# Patient Record
Sex: Male | Born: 1980
Health system: Southern US, Community
[De-identification: ages and names within clinical notes are randomized; demographics above are authoritative.]

## PROBLEM LIST (undated history)

## (undated) DIAGNOSIS — K529 Noninfective gastroenteritis and colitis, unspecified: Secondary | ICD-10-CM

## (undated) DIAGNOSIS — G589 Mononeuropathy, unspecified: Secondary | ICD-10-CM

## (undated) HISTORY — DX: Noninfective gastroenteritis and colitis, unspecified: K52.9

## (undated) HISTORY — DX: Mononeuropathy, unspecified: G58.9

## (undated) HISTORY — PX: COLONOSCOPY: SHX174

---
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
## 2012-03-04 ENCOUNTER — Ambulatory Visit: Payer: Managed Care, Other (non HMO)

## 2012-03-04 ENCOUNTER — Ambulatory Visit (INDEPENDENT_AMBULATORY_CARE_PROVIDER_SITE_OTHER): Payer: Managed Care, Other (non HMO) | Admitting: Family Medicine

## 2012-03-04 VITALS — BP 136/80 | HR 54 | Temp 97.9°F | Resp 16 | Ht 69.0 in | Wt 174.0 lb

## 2012-03-04 DIAGNOSIS — R0789 Other chest pain: Secondary | ICD-10-CM

## 2012-03-04 DIAGNOSIS — K51911 Ulcerative colitis, unspecified with rectal bleeding: Secondary | ICD-10-CM | POA: Insufficient documentation

## 2012-03-04 DIAGNOSIS — K5190 Ulcerative colitis, unspecified, without complications: Secondary | ICD-10-CM | POA: Insufficient documentation

## 2012-03-04 DIAGNOSIS — R071 Chest pain on breathing: Secondary | ICD-10-CM

## 2012-03-04 MED ORDER — MELOXICAM 15 MG PO TABS: 15.0000 mg | ORAL_TABLET | Freq: Every day | ORAL | Status: DC

## 2012-03-04 NOTE — Progress Notes (Signed)
Subjective:    Patient ID: Spencer Mccarthy, male    DOB: January 14, 1981, 31 y.o.   MRN: [id number]  HPI This 31 y.o. male presents for evaluation of rib pain x 2.5 weeks.  Describes weakness in the muscles along his spine, and anterior rib pain, with intermittent cramping and the sensation that his air is getting pushed out of his lungs when he's rotating.  The symptoms resolve with activity, and can be "excruciating" when he's at rest.  Wakes in the morning with pain, but the pain does not wake him from sleep.  No shortness of breath.  No cough.  He's able to jog several miles, as usual, without pain or discomfort.  He's under lots of stress, at work, school, moving, etc.  Review of Systems As above.  Past Medical History  Diagnosis Date  . Pinched nerve     low back  . Colitis     Past Surgical History  Procedure Date  . Colonoscopy     Prior to Admission medications   Medication Sig Start Date End Date Taking? Authorizing Provider  mesalamine (LIALDA) 1.2 G EC tablet Take 1,200 mg by mouth daily with breakfast.   Yes Historical Provider, MD    No Known Allergies  History   Social History  . Marital Status: Single    Spouse Name: n/a    Number of Children: 0  . Years of Education: 15   Occupational History  . Painter-Automotive refinishing   . Student     Business-[employer]   Social History Main Topics  . Smoking status: Current Every Day Smoker -- 0.5 packs/day    Types: Cigarettes  . Smokeless tobacco: Never Used   Comment: has quit x 8 weeks previously, will quit by 05/2012 for insurance costs.  . Alcohol Use: 0.0 - 1.0 oz/week    0-2 drink(s) per week  . Drug Use: No  . Sexually Active: Yes -- Male partner(s)    Birth Control/ Protection: Condom   Other Topics Concern  . Not on file   Social History Narrative   Lives with 2 roommates, will live alone beginning 03/2012.    Family History  Problem Relation Age of Onset  . Colitis Mother          Objective:   Physical Exam Blood pressure 136/80, pulse 54, temperature 97.9 F (36.6 C), temperature source Oral, resp. rate 16, height 5' 9"  (1.753 m), weight 174 lb (78.926 kg), SpO2 98.00%. Body mass index is 25.70 kg/(m^2). Well-developed, well nourished WM who is awake, alert and oriented, in NAD. HEENT: [place]/AT, PERRL, EOMI.  Sclera and conjunctiva are clear.  EAC are patent, TMs are normal in appearance. Nasal mucosa is pink and moist. OP is clear. Neck: supple, non-tender, no lymphadenopathy, thyromegaly. Heart: RRR, no murmur Lungs: normal effort, CTA Chest Wall: non-tender with palpation of the ribs. Abdomen: normo-active bowel sounds, supple, non-tender, no mass or organomegaly. Extremities: no cyanosis, clubbing or edema. Back: No tenderness,  FROM without pain.   Skin: warm and dry without rash. Psychologic: good mood and appropriate affect, normal speech and behavior.  CXR: [facility] reading (PRIMARY) by  Dr. Lorelei Pont.  No acute findings.  Normal chest.      Assessment & Plan:   1. Chest wall pain  DG Chest 2 View, meloxicam (MOBIC) 15 MG tablet   Undetermined etiology.  Trial of meloxicam daily, and leftover flexeril at HS.  RTC if symptoms persist. OOW x several days; consider stress as cause  for these symptoms.

## 2012-10-08 ENCOUNTER — Ambulatory Visit: Payer: Managed Care, Other (non HMO)

## 2012-11-03 ENCOUNTER — Ambulatory Visit (INDEPENDENT_AMBULATORY_CARE_PROVIDER_SITE_OTHER): Payer: Managed Care, Other (non HMO) | Admitting: Family Medicine

## 2012-11-03 VITALS — BP 110/68 | HR 56 | Temp 98.0°F | Resp 18 | Ht 69.0 in | Wt 176.0 lb

## 2012-11-03 DIAGNOSIS — M545 Low back pain, unspecified: Secondary | ICD-10-CM

## 2012-11-03 MED ORDER — PREDNISONE 20 MG PO TABS: ORAL_TABLET | ORAL | Status: DC

## 2012-11-03 NOTE — Patient Instructions (Signed)
Yoga, swimming will help protect the back

## 2012-11-03 NOTE — Progress Notes (Signed)
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  He has had intermittent low back pain over several years, and the pain has been returning.  He could not work because of the pain yesterday and today.  The pain is more left sided along the belt line and radiates into the gluteal area.  No Numbness, but the leg feels a little weak when he first starts moving in the morning.  The pain is worse after lying still for awhile  No trouble with bladder.  He has a history of colitis which used to be treated with Lialda.  He no longer has colitis nor takes medicine for this.  Exercise:  Trail biking, jogging  No fever.  Objective:  NAD Normal abdominal exam Normal back inspection, palpation SLR weakly  Positive at 60 degrees on left, neg on right Normal KJ and AJ Good back ROM except painful forward flexion Negative knee crossover test Normal hip ROM  Assessment:  Recurrent back strain with mild sciatica  Plan:   Resume yoga, knee stretches.

## 2014-12-08 ENCOUNTER — Ambulatory Visit (INDEPENDENT_AMBULATORY_CARE_PROVIDER_SITE_OTHER): Payer: 59 | Admitting: Physician Assistant

## 2014-12-08 VITALS — BP 100/66 | HR 54 | Temp 98.3°F | Resp 16 | Ht 68.0 in | Wt 178.0 lb

## 2014-12-08 DIAGNOSIS — Z8719 Personal history of other diseases of the digestive system: Secondary | ICD-10-CM

## 2014-12-08 DIAGNOSIS — M545 Low back pain, unspecified: Secondary | ICD-10-CM

## 2014-12-08 MED ORDER — PREDNISONE 20 MG PO TABS: ORAL_TABLET | ORAL | Status: DC

## 2014-12-08 MED ORDER — CYCLOBENZAPRINE HCL 10 MG PO TABS: 10.0000 mg | ORAL_TABLET | Freq: Every day | ORAL | Status: DC

## 2014-12-08 MED ORDER — MELOXICAM 15 MG PO TABS: 15.0000 mg | ORAL_TABLET | Freq: Every day | ORAL | Status: DC

## 2014-12-08 NOTE — Progress Notes (Signed)
12/09/2014 at 6:37 PM  Spencer Mccarthy / DOB: 10-Jul-1980 / MRN: [id number]  The patient has Ulcerative colitis on his problem list.  SUBJECTIVE  Chief complaint: Back Pain   [name] is a 34 y.o. male with a history of sporadic low back pain complaining of stable "dull" left sided low back pain that started 4 days ago. Associated symptoms include no other symptoms, and he denies weakness, numbness, tingling, dysuria, leg pain, perianal numbness, and bowel and bladder incontinence.Treatments tried thus far include prescription NSAIDS with poor relief. With previous episodes of pain he has had good success with steroid therapy.  Patient was diagnosed with Ulcerative Colitis two years previous and has been off of his medications since that time.  He would like a referral to GI to reestablish care.    He  has a past medical history of Pinched nerve; Colitis; and Colitis.    Medications reviewed and updated by myself where necessary, and exist elsewhere in the encounter.   [name] has No Known Allergies. He  reports that he has been smoking Cigarettes.  He has been smoking about 0.50 packs per day. He has never used smokeless tobacco. He reports that he drinks alcohol. He reports that he does not use illicit drugs. He  reports that he currently engages in sexual activity and has had male partners. He reports using the following method of birth control/protection: Condom. The patient  has past surgical history that includes Colonoscopy.  His family history includes Colitis in his mother.  Review of Systems  Constitutional: Negative for fever.  Eyes: Negative.   Respiratory: Negative.   Cardiovascular: Negative.   Genitourinary: Negative for dysuria, urgency and frequency.  Musculoskeletal: Positive for myalgias and back pain. Negative for falls.  Skin: Negative for itching and rash.  Neurological: Negative for dizziness and headaches.    OBJECTIVE  His  height is 5'  8" (1.727 m) and weight is 178 lb (80.74 kg). His oral temperature is 98.3 F (36.8 C). His blood pressure is 100/66 and his pulse is 54. His respiration is 16 and oxygen saturation is 98%.  The patient's body mass index is 27.07 kg/(m^2).  Physical Exam  Constitutional: He is oriented to person, place, and time. He appears well-developed.  HENT:  Head: Normocephalic.  Eyes: Pupils are equal, round, and reactive to light.  Neck: Normal range of motion.  Cardiovascular: Normal rate.   Respiratory: Effort normal.  GI: Soft.  Musculoskeletal: Normal range of motion.  Neurological: He is alert and oriented to person, place, and time. He has normal strength. He displays normal reflexes. No cranial nerve deficit or sensory deficit. Coordination and gait normal. He displays no Babinski's sign on the right side. He displays no Babinski's sign on the left side.  Reflex Scores:      Patellar reflexes are 2+ on the right side and 2+ on the left side.      Achilles reflexes are 2+ on the right side and 2+ on the left side. Negative SLR   Skin: Skin is warm and dry.    No results found for this or any previous visit (from the past 24 hour(s)).  ASSESSMENT & PLAN  Spencer Mccarthy was seen today for back pain.  Diagnoses and all orders for this visit:  History of ulcerative colitis Orders: -     Ambulatory referral to Gastroenterology  Left-sided low back pain without sciatica Orders: -     predniSONE (DELTASONE) 20 MG tablet;  Take 3 PO QAM x3days, 2 PO QAM x3days, 1 PO QAM x3days -     meloxicam (MOBIC) 15 MG tablet; Take 1 tablet (15 mg total) by mouth daily. -     cyclobenzaprine (FLEXERIL) 10 MG tablet; Take 1 tablet (10 mg total) by mouth at bedtime.    The patient was advised to call or come back to clinic if he does not see an improvement in symptoms, or worsens with the above plan.   Philis Fendt, MHS, PA-C [facility] 12/09/2014 6:37  PM

## 2014-12-16 NOTE — Progress Notes (Signed)
  Medical screening examination/treatment/procedure(s) were performed by non-physician practitioner and as supervising physician I was immediately available for consultation/collaboration.     

## 2015-04-20 ENCOUNTER — Ambulatory Visit (INDEPENDENT_AMBULATORY_CARE_PROVIDER_SITE_OTHER): Payer: BLUE CROSS/BLUE SHIELD | Admitting: Physician Assistant

## 2015-04-20 VITALS — BP 122/68 | HR 55 | Temp 97.8°F | Resp 18 | Ht 69.5 in | Wt 182.0 lb

## 2015-04-20 DIAGNOSIS — J069 Acute upper respiratory infection, unspecified: Secondary | ICD-10-CM

## 2015-04-20 DIAGNOSIS — Z8719 Personal history of other diseases of the digestive system: Secondary | ICD-10-CM

## 2015-04-20 MED ORDER — MESALAMINE 1.2 G PO TBEC: 1.2000 g | DELAYED_RELEASE_TABLET | Freq: Every day | ORAL | Status: DC

## 2015-04-20 MED ORDER — AZELASTINE HCL 0.1 % NA SOLN: 2.0000 | Freq: Two times a day (BID) | NASAL | Status: DC

## 2015-04-20 MED ORDER — CEFDINIR 300 MG PO CAPS: 600.0000 mg | ORAL_CAPSULE | Freq: Every day | ORAL | Status: AC

## 2015-04-20 NOTE — Progress Notes (Signed)
Patient ID: Spencer Mccarthy, male    DOB: 1981/02/05, 34 y.o.   MRN: [id number]  PCP: No primary care provider on file.  Subjective:   Chief Complaint  Patient presents with  . Nasal Congestion    head congestion since saturday     HPI Presents for evaluation of congestion.  Doesn't feel weak, but progressively "sludging up." Alka seltzer, nasal spray (saline), Vitamin C, whiskey. Without benefit. Recently quit smoking using the patch. Notes he has really vivid dreams. He has to remove the patch about 3 hours before bed.   No flu vaccine yet this season.     Review of Systems  Constitutional: Positive for fatigue. Negative for fever, chills, diaphoresis, activity change and appetite change.  HENT: Positive for congestion, postnasal drip and rhinorrhea. Negative for dental problem, ear pain, facial swelling, sneezing, sore throat and trouble swallowing.   Eyes: Negative for pain.  Respiratory: Negative for cough and shortness of breath.   Cardiovascular: Negative for chest pain and palpitations.  Gastrointestinal: Negative for nausea and diarrhea.  Musculoskeletal: Negative for myalgias and arthralgias.  Neurological: Negative for dizziness and headaches.  Hematological: Negative for adenopathy.       Patient Active Problem List   Diagnosis Date Noted  . Ulcerative colitis ([place]) 03/04/2012     Prior to Admission medications   Medication Sig Start Date End Date Taking? Authorizing Provider  cyclobenzaprine (FLEXERIL) 10 MG tablet Take 1 tablet (10 mg total) by mouth at bedtime. 12/08/14  Yes Tereasa Coop, PA-C  meloxicam (MOBIC) 15 MG tablet Take 1 tablet (15 mg total) by mouth daily. 12/08/14  Yes Tereasa Coop, PA-C  mesalamine (LIALDA) 1.2 G EC tablet Take 1,200 mg by mouth daily with breakfast.    Historical Provider, MD     No Known Allergies     Objective:  Physical Exam  Constitutional: He is oriented to person, place, and time. Vital signs are  normal. He appears well-developed and well-nourished. He is active and cooperative. No distress.  BP 122/68 mmHg  Pulse 55  Temp(Src) 97.8 F (36.6 C) (Oral)  Resp 18  Ht 5' 9.5" (1.765 m)  Wt 182 lb (82.555 kg)  BMI 26.50 kg/m2  SpO2 98%   HENT:  Head: Normocephalic and atraumatic.  Right Ear: Hearing, tympanic membrane, external ear and ear canal normal.  Left Ear: Hearing, tympanic membrane, external ear and ear canal normal.  Nose: Mucosal edema and rhinorrhea present.  No foreign bodies. Right sinus exhibits no maxillary sinus tenderness and no frontal sinus tenderness. Left sinus exhibits no maxillary sinus tenderness and no frontal sinus tenderness.  Mouth/Throat: Uvula is midline, oropharynx is clear and moist and mucous membranes are normal. No uvula swelling. No oropharyngeal exudate.  Eyes: Conjunctivae and EOM are normal. Pupils are equal, round, and reactive to light. Right eye exhibits no discharge. Left eye exhibits no discharge. No scleral icterus.  Neck: Trachea normal, normal range of motion and full passive range of motion without pain. Neck supple. No thyroid mass and no thyromegaly present.  Cardiovascular: Normal rate, regular rhythm and normal heart sounds.   Pulmonary/Chest: Effort normal and breath sounds normal.  Lymphadenopathy:       Head (right side): No submandibular, no tonsillar, no preauricular, no posterior auricular and no occipital adenopathy present.       Head (left side): No submandibular, no tonsillar, no preauricular and no occipital adenopathy present.    He has no cervical adenopathy.  Right: No supraclavicular adenopathy present.       Left: No supraclavicular adenopathy present.  Neurological: He is alert and oriented to person, place, and time. He has normal strength. No cranial nerve deficit or sensory deficit.  Skin: Skin is warm, dry and intact. No rash noted.  Psychiatric: He has a normal mood and affect. His speech is normal and  behavior is normal.           Assessment & Plan:   1. Acute upper respiratory infection Supportive care. ANticipatory guidance. Anticipate improvement in 48 hours and resolution by day 8. If he does not begin to improve on Saturday 12/10, he will go ahead and start the antibiotic. - azelastine (ASTELIN) 0.1 % nasal spray; Place 2 sprays into both nostrils 2 (two) times daily. Use in each nostril as directed  Dispense: 30 mL; Refill: 0 - cefdinir (OMNICEF) 300 MG capsule; Take 2 capsules (600 mg total) by mouth daily.  Dispense: 20 capsule; Refill: 0  2. History of ulcerative colitis He is out of Lialda and is about to move to [place]. His insurance will change next week and then he will need to establish with a new PCP in [place] in order to establish with anew GI specialist. I refilled the Lialda for 30 days to help him transition. - mesalamine (LIALDA) 1.2 G EC tablet; Take 1 tablet (1.2 g total) by mouth daily with breakfast.  Dispense: 30 tablet; Refill: 0   Fara Chute, PA-C Physician Assistant-Certified [facility]

## 2015-04-20 NOTE — Patient Instructions (Signed)
Get plenty of rest and drink at least 64 ounces of water daily.  Use the Mucinex and an OTC oral antihistamine (Allegra, Claritin or Zyrtec). If the symptoms have not begun to resolve by Saturday, fill the prescription for Cefdinir.

## 2015-10-31 ENCOUNTER — Other Ambulatory Visit: Payer: Self-pay | Admitting: Physician Assistant

## 2015-10-31 ENCOUNTER — Telehealth: Payer: Self-pay

## 2015-10-31 NOTE — Telephone Encounter (Signed)
I responded to a RF request from a local pharm that was sent this afternoon. He can have it transferred. Chelle instr'd him to est care w/new PCP in [place] since he was moving and only gave him 1 RF in Dec. Has pt moved? The pharm number pt left appears to be from [place]. Pt must get further refills from new PCP, or RTC here for re-check. We will need to try to call pt back when phones are working.

## 2015-10-31 NOTE — Telephone Encounter (Signed)
Pt is checking on a status of a refill request for lialida The pharnacy number is [id number]

## 2015-11-01 NOTE — Telephone Encounter (Signed)
LM on Vm to RTC.

## 2015-12-18 ENCOUNTER — Other Ambulatory Visit: Payer: Self-pay | Admitting: Physician Assistant

## 2015-12-22 ENCOUNTER — Encounter: Payer: Self-pay | Admitting: Physician Assistant

## 2015-12-22 ENCOUNTER — Ambulatory Visit (INDEPENDENT_AMBULATORY_CARE_PROVIDER_SITE_OTHER): Payer: 59 | Admitting: Physician Assistant

## 2015-12-22 VITALS — BP 124/66 | HR 55 | Temp 98.5°F | Resp 16 | Ht 68.75 in | Wt 192.4 lb

## 2015-12-22 DIAGNOSIS — Z8719 Personal history of other diseases of the digestive system: Secondary | ICD-10-CM | POA: Diagnosis not present

## 2015-12-22 MED ORDER — MESALAMINE 1.2 G PO TBEC: DELAYED_RELEASE_TABLET | ORAL | 3 refills | Status: AC

## 2015-12-22 NOTE — Patient Instructions (Addendum)
Make appt with your GI doctor for medication management   Thank you for coming in today. I hope you feel we met your needs.  Feel free to call [facility] if you have any questions or further requests.  Please consider signing up for MyChart if you do not already have it, as this is a great way to communicate with me.  Best,  Whitney McVey, PA-C    IF you received an x-ray today, you will receive an invoice from [facility]. Please contact [facility] at [phone] with questions or concerns regarding your invoice.   IF you received labwork today, you will receive an invoice from [facility] [employer]. Please contact [facility] at [phone] with questions or concerns regarding your invoice.   Our billing staff will not be able to assist you with questions regarding bills from these companies.  You will be contacted with the lab results as soon as they are available. The fastest way to get your results is to activate your My Chart account. Instructions are located on the last page of this paperwork. If you have not heard from [place] regarding the results in 2 weeks, please contact this office.

## 2015-12-22 NOTE — Progress Notes (Signed)
   Spencer Mccarthy  MRN: [id number] DOB: 09/12/1980  PCP: No primary care provider on file.  Subjective:  Pt presents to clinic for refill of Lialda.  He recently moved to [place], but is moving back soon. Did not establish care there as previously discussed at his last appointment and would like to continue care at [facility].   Has not been adhearant. A few months ago he felt better so he stopped taking it. He then had a UC flare.  Recently quit smoking, says he has noticed an improvement in symptoms since then.   Sees GI once a year for UC check-up. [facility] - Felix Pacini, MD.   Review of Systems  Constitutional: Negative.   Cardiovascular: Negative for chest pain and palpitations.  Gastrointestinal: Positive for abdominal pain (intermittently ). Negative for abdominal distention, blood in stool, constipation, diarrhea and nausea.    Patient Active Problem List   Diagnosis Date Noted  . Ulcerative colitis ([place]) 03/04/2012    Current Outpatient Prescriptions on File Prior to Visit  Medication Sig Dispense Refill  . LIALDA 1.2 g EC tablet TAKE 1 TABLET (1.2 G TOTAL) BY MOUTH DAILY WITH BREAKFAST. 30 tablet 0   No current facility-administered medications on file prior to visit.     No Known Allergies  Objective:  BP 124/66 (BP Location: Right Arm, Patient Position: Sitting, Cuff Size: Normal)   Pulse (!) 55   Temp 98.5 F (36.9 C) (Oral)   Resp 16   Ht 5' 8.75" (1.746 m)   Wt 192 lb 6.4 oz (87.3 kg)   SpO2 98%   BMI 28.62 kg/m   Physical Exam  Constitutional: He is oriented to person, place, and time and well-developed, well-nourished, and in no distress. No distress.  HENT:  Head: Normocephalic and atraumatic.  Cardiovascular: Normal rate and regular rhythm.   Abdominal: Soft. Bowel sounds are normal. There is no tenderness. There is no guarding.  Neurological: He is alert and oriented to person, place, and time. Gait normal.  Skin: Skin is warm and dry. He  is not diaphoretic.  Psychiatric: Mood, memory, affect and judgment normal.  Vitals reviewed.   Assessment and Plan :  1. History of ulcerative colitis - mesalamine (LIALDA) 1.2 g EC tablet; TAKE 1 TABLET (1.2 G TOTAL) BY MOUTH DAILY WITH BREAKFAST.  Dispense: 30 tablet; Refill: 3  - Discussed with patient Lialda medication management through his GI specialist who diagnosed him with UC.  He originally filled Lialda with [facility] because of cost: with GI it was $500, [facility] was $30.  Recent insurance change, so he will investigate how much it will cost to get medication there.  He is going on his honeymoon in Sept and is moving back to [place] from [place]. Patient was given a 3 month supply to get him through to his GI appointment.    Mercer Pod, PA-C  [facility] 12/22/2015 2:09 PM

## 2016-09-30 DIAGNOSIS — K5150 Left sided colitis without complications: Secondary | ICD-10-CM | POA: Diagnosis not present

## 2016-11-25 DIAGNOSIS — K5150 Left sided colitis without complications: Secondary | ICD-10-CM | POA: Diagnosis not present

## 2017-04-19 DIAGNOSIS — Z23 Encounter for immunization: Secondary | ICD-10-CM | POA: Diagnosis not present

## 2017-06-05 DIAGNOSIS — K5150 Left sided colitis without complications: Secondary | ICD-10-CM | POA: Diagnosis not present

## 2017-09-03 DIAGNOSIS — J00 Acute nasopharyngitis [common cold]: Secondary | ICD-10-CM | POA: Diagnosis not present

## 2017-10-11 ENCOUNTER — Other Ambulatory Visit: Payer: Self-pay

## 2017-10-11 ENCOUNTER — Encounter: Payer: Self-pay | Admitting: Physician Assistant

## 2017-10-11 ENCOUNTER — Ambulatory Visit (INDEPENDENT_AMBULATORY_CARE_PROVIDER_SITE_OTHER): Payer: 59 | Admitting: Physician Assistant

## 2017-10-11 VITALS — BP 120/66 | HR 52 | Temp 98.1°F | Ht 69.0 in | Wt 190.6 lb

## 2017-10-11 DIAGNOSIS — R21 Rash and other nonspecific skin eruption: Secondary | ICD-10-CM

## 2017-10-11 DIAGNOSIS — W57XXXA Bitten or stung by nonvenomous insect and other nonvenomous arthropods, initial encounter: Secondary | ICD-10-CM | POA: Diagnosis not present

## 2017-10-11 DIAGNOSIS — L299 Pruritus, unspecified: Secondary | ICD-10-CM

## 2017-10-11 MED ORDER — PREDNISONE 20 MG PO TABS: ORAL_TABLET | ORAL | 0 refills | Status: AC

## 2017-10-11 MED ORDER — PERMETHRIN 5 % EX CREA: TOPICAL_CREAM | CUTANEOUS | 1 refills | Status: DC

## 2017-10-11 MED ORDER — DOXYCYCLINE HYCLATE 100 MG PO CAPS: 100.0000 mg | ORAL_CAPSULE | Freq: Two times a day (BID) | ORAL | 0 refills | Status: AC

## 2017-10-11 NOTE — Patient Instructions (Addendum)
Start the prednisone and doxy today.  I will go ahead and use the cream from the neck down and leave on for 12 hours as well.  I expect she will feel better in the next 2 to 3 days.  If any problems please come back and see me.    IF you received an x-ray today, you will receive an invoice from [facility]. Please contact [facility] at [phone] with questions or concerns regarding your invoice.   IF you received labwork today, you will receive an invoice from [facility]. Please contact [facility] at [phone] with questions or concerns regarding your invoice.   Our billing staff will not be able to assist you with questions regarding bills from these companies.  You will be contacted with the lab results as soon as they are available. The fastest way to get your results is to activate your My Chart account. Instructions are located on the last page of this paperwork. If you have not heard from [place] regarding the results in 2 weeks, please contact this office.

## 2017-10-11 NOTE — Progress Notes (Signed)
10/13/2017 1:45 PM   DOB: April 02, 1981 / MRN: [id number]  SUBJECTIVE:  Spencer Mccarthy is a 37 y.o. male presenting for rash and fatigue.  Tells me the rash started a few days ago and was preceded by a tick bite in which the tick was removed quickly.  Been doing lots of yard work lately.  Complains mostly of rash about the legs and arms.  Denies fever.  He has No Known Allergies.   He  has a past medical history of Colitis and Pinched nerve.    He  reports that he has quit smoking. His smoking use included cigarettes. He smoked 0.50 packs per day. He has never used smokeless tobacco. He reports that he drinks alcohol. He reports that he does not use drugs. He  reports that he currently engages in sexual activity and has had partners who are Male. He reports using the following method of birth control/protection: Condom. The patient  has a past surgical history that includes Colonoscopy.  His family history includes Colitis in his mother.  Review of Systems  Constitutional: Negative for chills, diaphoresis and fever.  Eyes: Negative.   Respiratory: Negative for cough, hemoptysis, sputum production, shortness of breath and wheezing.   Cardiovascular: Negative for chest pain, orthopnea and leg swelling.  Gastrointestinal: Negative for abdominal pain, blood in stool, constipation, diarrhea, heartburn, melena, nausea and vomiting.  Genitourinary: Negative for dysuria, flank pain, frequency, hematuria and urgency.  Skin: Positive for itching and rash.  Neurological: Negative for dizziness, sensory change, speech change, focal weakness and headaches.    The problem list and medications were reviewed and updated by myself where necessary and exist elsewhere in the encounter.   OBJECTIVE:  BP 120/66 (BP Location: Left Arm, Patient Position: Sitting, Cuff Size: Normal)   Pulse (!) 52   Temp 98.1 F (36.7 C) (Oral)   Ht 5' 9"  (1.753 m)   Wt 190 lb 9.6 oz (86.5 kg)   SpO2 97%   BMI 28.15  kg/m   Physical Exam  Constitutional: He is oriented to person, place, and time. He appears well-developed. He does not appear ill.  Eyes: Pupils are equal, round, and reactive to light. Conjunctivae and EOM are normal.  Cardiovascular: Normal rate, regular rhythm, S1 normal, S2 normal, normal heart sounds, intact distal pulses and normal pulses. Exam reveals no gallop and no friction rub.  No murmur heard. Pulmonary/Chest: Effort normal. No stridor. No respiratory distress. He has no wheezes. He has no rales.  Abdominal: He exhibits no distension.  Musculoskeletal: Normal range of motion. He exhibits no edema.  Neurological: He is alert and oriented to person, place, and time. No cranial nerve deficit. Coordination normal.  Skin: Skin is warm and dry. He is not diaphoretic.  Psychiatric: He has a normal mood and affect.  Nursing note and vitals reviewed.   No results found for this or any previous visit (from the past 72 hour(s)).  No results found.  ASSESSMENT AND PLAN:  Spencer Mccarthy was seen today for red spots all over body.  Diagnoses and all orders for this visit:  Papular rash -     predniSONE (DELTASONE) 20 MG tablet; Take 3 in the morning for 3 days, then 2 in the morning for 3 days, and then 1 in the morning for 3 days.  Tick bite, initial encounter -     doxycycline (VIBRAMYCIN) 100 MG capsule; Take 1 capsule (100 mg total) by mouth 2 (two) times daily for 10  days.  Pruritic condition -     predniSONE (DELTASONE) 20 MG tablet; Take 3 in the morning for 3 days, then 2 in the morning for 3 days, and then 1 in the morning for 3 days. -     permethrin (ELIMITE) 5 % cream; Apply the lotion from the neck down and leave on for 12 hours.  Repeat in 7 days.    The patient is advised to call or return to clinic if he does not see an improvement in symptoms, or to seek the care of the closest emergency department if he worsens with the above plan.   Philis Fendt, MHS, PA-C [facility] 10/13/2017 1:45 PM

## 2017-10-31 DIAGNOSIS — A0472 Enterocolitis due to Clostridium difficile, not specified as recurrent: Secondary | ICD-10-CM | POA: Diagnosis not present

## 2017-10-31 DIAGNOSIS — R05 Cough: Secondary | ICD-10-CM | POA: Diagnosis not present

## 2017-10-31 DIAGNOSIS — R030 Elevated blood-pressure reading, without diagnosis of hypertension: Secondary | ICD-10-CM | POA: Diagnosis not present

## 2018-02-05 ENCOUNTER — Other Ambulatory Visit: Payer: Self-pay

## 2018-02-05 ENCOUNTER — Ambulatory Visit (INDEPENDENT_AMBULATORY_CARE_PROVIDER_SITE_OTHER): Payer: 59

## 2018-02-05 ENCOUNTER — Ambulatory Visit (INDEPENDENT_AMBULATORY_CARE_PROVIDER_SITE_OTHER): Payer: 59 | Admitting: Urgent Care

## 2018-02-05 ENCOUNTER — Encounter: Payer: Self-pay | Admitting: Urgent Care

## 2018-02-05 VITALS — BP 151/79 | HR 67 | Temp 98.2°F | Resp 18 | Ht 69.0 in | Wt 192.6 lb

## 2018-02-05 DIAGNOSIS — J22 Unspecified acute lower respiratory infection: Secondary | ICD-10-CM

## 2018-02-05 DIAGNOSIS — R05 Cough: Secondary | ICD-10-CM

## 2018-02-05 DIAGNOSIS — R059 Cough, unspecified: Secondary | ICD-10-CM

## 2018-02-05 DIAGNOSIS — Z87891 Personal history of nicotine dependence: Secondary | ICD-10-CM

## 2018-02-05 DIAGNOSIS — R0989 Other specified symptoms and signs involving the circulatory and respiratory systems: Secondary | ICD-10-CM

## 2018-02-05 MED ORDER — AZITHROMYCIN 250 MG PO TABS: ORAL_TABLET | ORAL | 0 refills | Status: DC

## 2018-02-05 MED ORDER — HYDROCODONE-HOMATROPINE 5-1.5 MG/5ML PO SYRP: 5.0000 mL | ORAL_SOLUTION | Freq: Every evening | ORAL | 0 refills | Status: DC | PRN

## 2018-02-05 MED ORDER — PREDNISONE 20 MG PO TABS
ORAL_TABLET | ORAL | 0 refills | Status: DC
Start: 2018-02-05 — End: 2020-12-11

## 2018-02-05 MED ORDER — ALBUTEROL SULFATE HFA 108 (90 BASE) MCG/ACT IN AERS: 2.0000 | INHALATION_SPRAY | Freq: Four times a day (QID) | RESPIRATORY_TRACT | 1 refills | Status: DC | PRN

## 2018-02-05 NOTE — Patient Instructions (Addendum)
Cough, Adult Coughing is a reflex that clears your throat and your airways. Coughing helps to heal and protect your lungs. It is normal to cough occasionally, but a cough that happens with other symptoms or lasts a long time may be a sign of a condition that needs treatment. A cough may last only 2-3 weeks (acute), or it may last longer than 8 weeks (chronic). What are the causes? Coughing is commonly caused by:  Breathing in substances that irritate your lungs.  A viral or bacterial respiratory infection.  Allergies.  Asthma.  Postnasal drip.  Smoking.  Acid backing up from the stomach into the esophagus (gastroesophageal reflux).  Certain medicines.  Chronic lung problems, including COPD (or rarely, lung cancer).  Other medical conditions such as heart failure.  Follow these instructions at home: Pay attention to any changes in your symptoms. Take these actions to help with your discomfort:  Take medicines only as told by your health care provider. ? If you were prescribed an antibiotic medicine, take it as told by your health care provider. Do not stop taking the antibiotic even if you start to feel better. ? Talk with your health care provider before you take a cough suppressant medicine.  Drink enough fluid to keep your urine clear or pale yellow.  If the air is dry, use a cold steam vaporizer or humidifier in your bedroom or your home to help loosen secretions.  Avoid anything that causes you to cough at work or at home.  If your cough is worse at night, try sleeping in a semi-upright position.  Avoid cigarette smoke. If you smoke, quit smoking. If you need help quitting, ask your health care provider.  Avoid caffeine.  Avoid alcohol.  Rest as needed.  Contact a health care provider if:  You have new symptoms.  You cough up pus.  Your cough does not get better after 2-3 weeks, or your cough gets worse.  You cannot control your cough with suppressant  medicines and you are losing sleep.  You develop pain that is getting worse or pain that is not controlled with pain medicines.  You have a fever.  You have unexplained weight loss.  You have night sweats. Get help right away if:  You cough up blood.  You have difficulty breathing.  Your heartbeat is very fast. This information is not intended to replace advice given to you by your health care provider. Make sure you discuss any questions you have with your health care provider. Document Released: 10/26/2010 Document Revised: 10/05/2015 Document Reviewed: 07/06/2014 Elsevier Interactive Patient Education  [employer].     If you have lab work done today you will be contacted with your lab results within the next 2 weeks.  If you have not heard from [place] then please contact us. The fastest way to get your results is to register for My Chart.   IF you received an x-ray today, you will receive an invoice from [facility]. Please contact [facility] at [phone] with questions or concerns regarding your invoice.   IF you received labwork today, you will receive an invoice from [facility]. Please contact [facility] at [phone] with questions or concerns regarding your invoice.   Our billing staff will not be able to assist you with questions regarding bills from these companies.  You will be contacted with the lab results as soon as they are available. The fastest way to get your results is to activate your My Chart account.  Instructions are located on the last page of this paperwork. If you have not heard from [place] regarding the results in 2 weeks, please contact this office.

## 2018-02-05 NOTE — Progress Notes (Signed)
    MRN: [id number] DOB: 1980-12-05  Subjective:   Spencer Mccarthy is a 37 y.o. male presenting for 3 week history of persistent dry cough, chest congestion. Cough elicits coughing fits, chest pain. Has also had malaise, mild throat pain from the cough. Denies fever, sinus pain, ear pain.  reports that he has quit smoking. His smoking use included cigarettes. He smoked 0.50 packs per day. He has never used smokeless tobacco. He reports that he drinks alcohol. He reports that he does not use drugs.   Spencer Mccarthy has a current medication list which includes the following prescription(s): mesalamine and permethrin. Also has No Known Allergies.  Spencer Mccarthy  has a past medical history of Colitis and Pinched nerve. Also  has a past surgical history that includes Colonoscopy.  Objective:   Vitals: BP (!) 151/79   Pulse 67   Temp 98.2 F (36.8 C) (Oral)   Resp 18   Ht 5' 9"  (1.753 m)   Wt 192 lb 9.6 oz (87.4 kg)   SpO2 93%   BMI 28.44 kg/m   Physical Exam  Constitutional: He is oriented to person, place, and time. He appears well-developed and well-nourished.  HENT:  Mouth/Throat: Oropharynx is clear and moist.  Cardiovascular: Normal rate, regular rhythm, normal heart sounds and intact distal pulses. Exam reveals no gallop and no friction rub.  No murmur heard. Pulmonary/Chest: Effort normal. No stridor. No respiratory distress. He has no wheezes. He has no rales.  Coarse lung sounds in mid to lower fields, slightly decreased lung sounds.  Neurological: He is alert and oriented to person, place, and time.  Skin: Skin is warm and dry.   Dg Chest 2 View  Result Date: 02/05/2018 CLINICAL DATA:  Cough, congestion. EXAM: CHEST - 2 VIEW COMPARISON:  Radiographs of March 04, 2012. FINDINGS: The heart size and mediastinal contours are within normal limits. Both lungs are clear. No pneumothorax or pleural effusion is noted. The visualized skeletal structures are unremarkable. IMPRESSION: No active  cardiopulmonary disease. Electronically Signed   By: Marijo Conception, M.D.   On: 02/05/2018 11:56    Assessment and Plan :   Lower respiratory infection  Cough - Plan: DG Chest 2 View  Chest congestion - Plan: DG Chest 2 View  History of smoking  Given smoking history, progression of his illness will cover for lower respiratory infection with azithromycin.  With his hypoxia we will use a steroid course with albuterol inhaler.  Cough suppression medications offered as well. Counseled patient on potential for adverse effects with medications prescribed today, patient verbalized understanding. Return-to-clinic precautions discussed, patient verbalized understanding.   Jaynee Eagles, PA-C [facility] at [facility] [phone] 02/05/2018  11:24 AM

## 2018-04-13 DIAGNOSIS — K5150 Left sided colitis without complications: Secondary | ICD-10-CM | POA: Diagnosis not present

## 2018-04-16 DIAGNOSIS — K5150 Left sided colitis without complications: Secondary | ICD-10-CM | POA: Diagnosis not present

## 2018-05-25 DIAGNOSIS — K5190 Ulcerative colitis, unspecified, without complications: Secondary | ICD-10-CM | POA: Diagnosis not present

## 2018-06-08 DIAGNOSIS — K529 Noninfective gastroenteritis and colitis, unspecified: Secondary | ICD-10-CM | POA: Diagnosis not present

## 2018-06-08 DIAGNOSIS — K5150 Left sided colitis without complications: Secondary | ICD-10-CM | POA: Diagnosis not present

## 2018-06-08 DIAGNOSIS — K5289 Other specified noninfective gastroenteritis and colitis: Secondary | ICD-10-CM | POA: Diagnosis not present

## 2018-06-08 LAB — HM COLONOSCOPY

## 2018-07-01 ENCOUNTER — Other Ambulatory Visit: Payer: Self-pay | Admitting: Gastroenterology

## 2018-07-01 ENCOUNTER — Encounter: Payer: Self-pay | Admitting: Emergency Medicine

## 2018-07-01 ENCOUNTER — Ambulatory Visit
Admission: RE | Admit: 2018-07-01 | Discharge: 2018-07-01 | Disposition: A | Discharge status: Home / Self Care | Payer: 59 | Source: Ambulatory Visit | Attending: Gastroenterology | Admitting: Gastroenterology

## 2018-07-01 ENCOUNTER — Ambulatory Visit (INDEPENDENT_AMBULATORY_CARE_PROVIDER_SITE_OTHER): Payer: 59 | Admitting: Emergency Medicine

## 2018-07-01 ENCOUNTER — Other Ambulatory Visit: Payer: Self-pay

## 2018-07-01 VITALS — BP 131/82 | HR 73 | Temp 98.4°F | Resp 16 | Ht 68.5 in | Wt 190.2 lb

## 2018-07-01 DIAGNOSIS — K5190 Ulcerative colitis, unspecified, without complications: Secondary | ICD-10-CM | POA: Diagnosis not present

## 2018-07-01 DIAGNOSIS — R059 Cough, unspecified: Secondary | ICD-10-CM

## 2018-07-01 DIAGNOSIS — R05 Cough: Secondary | ICD-10-CM

## 2018-07-01 DIAGNOSIS — R0981 Nasal congestion: Secondary | ICD-10-CM | POA: Diagnosis not present

## 2018-07-01 DIAGNOSIS — J0100 Acute maxillary sinusitis, unspecified: Secondary | ICD-10-CM

## 2018-07-01 MED ORDER — TRIAMCINOLONE ACETONIDE 55 MCG/ACT NA AERO: 2.0000 | INHALATION_SPRAY | Freq: Every day | NASAL | 12 refills | Status: DC

## 2018-07-01 MED ORDER — PSEUDOEPHEDRINE-GUAIFENESIN ER 60-600 MG PO TB12: 1.0000 | ORAL_TABLET | Freq: Two times a day (BID) | ORAL | 1 refills | Status: AC

## 2018-07-01 MED ORDER — AMOXICILLIN-POT CLAVULANATE 875-125 MG PO TABS: 1.0000 | ORAL_TABLET | Freq: Two times a day (BID) | ORAL | 0 refills | Status: AC

## 2018-07-01 NOTE — Patient Instructions (Addendum)
If you have lab work done today you will be contacted with your lab results within the next 2 weeks.  If you have not heard from [place] then please contact us. The fastest way to get your results is to register for My Chart.   IF you received an x-ray today, you will receive an invoice from [facility]. Please contact [facility] at [phone] with questions or concerns regarding your invoice.   IF you received labwork today, you will receive an invoice from [place]. Please contact [facility] at [phone] with questions or concerns regarding your invoice.   Our billing staff will not be able to assist you with questions regarding bills from these companies.  You will be contacted with the lab results as soon as they are available. The fastest way to get your results is to activate your My Chart account. Instructions are located on the last page of this paperwork. If you have not heard from [place] regarding the results in 2 weeks, please contact this office.     Sinusitis, Adult Sinusitis is soreness and swelling (inflammation) of your sinuses. Sinuses are hollow spaces in the bones around your face. They are located:  Around your eyes.  In the middle of your forehead.  Behind your nose.  In your cheekbones. Your sinuses and nasal passages are lined with a fluid called mucus. Mucus drains out of your sinuses. Swelling can trap mucus in your sinuses. This lets germs (bacteria, virus, or fungus) grow, which leads to infection. Most of the time, this condition is caused by a virus. What are the causes? This condition is caused by:  Allergies.  Asthma.  Germs.  Things that block your nose or sinuses.  Growths in the nose (nasal polyps).  Chemicals or irritants in the air.  Fungus (rare). What increases the risk? You are more likely to develop this condition if:  You have a weak body defense system (immune system).  You do a lot of swimming or  diving.  You use nasal sprays too much.  You smoke. What are the signs or symptoms? The main symptoms of this condition are pain and a feeling of pressure around the sinuses. Other symptoms include:  Stuffy nose (congestion).  Runny nose (drainage).  Swelling and warmth in the sinuses.  Headache.  Toothache.  A cough that may get worse at night.  Mucus that collects in the throat or the back of the nose (postnasal drip).  Being unable to smell and taste.  Being very tired (fatigue).  A fever.  Sore throat.  Bad breath. How is this diagnosed? This condition is diagnosed based on:  Your symptoms.  Your medical history.  A physical exam.  Tests to find out if your condition is short-term (acute) or long-term (chronic). Your doctor may: ? Check your nose for growths (polyps). ? Check your sinuses using a tool that has a light (endoscope). ? Check for allergies or germs. ? Do imaging tests, such as an MRI or CT scan. How is this treated? Treatment for this condition depends on the cause and whether it is short-term or long-term.  If caused by a virus, your symptoms should go away on their own within 10 days. You may be given medicines to relieve symptoms. They include: ? Medicines that shrink swollen tissue in the nose. ? Medicines that treat allergies (antihistamines). ? A spray that treats swelling of the nostrils. ? Rinses that help get rid of thick mucus in your  nose (nasal saline washes).  If caused by bacteria, your doctor may wait to see if you will get better without treatment. You may be given antibiotic medicine if you have: ? A very bad infection. ? A weak body defense system.  If caused by growths in the nose, you may need to have surgery. Follow these instructions at home: Medicines  Take, use, or apply over-the-counter and prescription medicines only as told by your doctor. These may include nasal sprays.  If you were prescribed an antibiotic  medicine, take it as told by your doctor. Do not stop taking the antibiotic even if you start to feel better. Hydrate and humidify   Drink enough water to keep your pee (urine) pale yellow.  Use a cool mist humidifier to keep the humidity level in your home above 50%.  Breathe in steam for 10-15 minutes, 3-4 times a day, or as told by your doctor. You can do this in the bathroom while a hot shower is running.  Try not to spend time in cool or dry air. Rest  Rest as much as you can.  Sleep with your head raised (elevated).  Make sure you get enough sleep each night. General instructions   Put a warm, moist washcloth on your face 3-4 times a day, or as often as told by your doctor. This will help with discomfort.  Wash your hands often with soap and water. If there is no soap and water, use hand sanitizer.  Do not smoke. Avoid being around people who are smoking (secondhand smoke).  Keep all follow-up visits as told by your doctor. This is important. Contact a doctor if:  You have a fever.  Your symptoms get worse.  Your symptoms do not get better within 10 days. Get help right away if:  You have a very bad headache.  You cannot stop throwing up (vomiting).  You have very bad pain or swelling around your face or eyes.  You have trouble seeing.  You feel confused.  Your neck is stiff.  You have trouble breathing. Summary  Sinusitis is swelling of your sinuses. Sinuses are hollow spaces in the bones around your face.  This condition is caused by tissues in your nose that become inflamed or swollen. This traps germs. These can lead to infection.  If you were prescribed an antibiotic medicine, take it as told by your doctor. Do not stop taking it even if you start to feel better.  Keep all follow-up visits as told by your doctor. This is important. This information is not intended to replace advice given to you by your health care provider. Make sure you discuss  any questions you have with your health care provider. Document Released: 10/16/2007 Document Revised: 09/29/2017 Document Reviewed: 09/29/2017 Elsevier Interactive Patient Education  2019 [employer].

## 2018-07-01 NOTE — Progress Notes (Signed)
Spencer Mccarthy 38 y.o.   Chief Complaint  Patient presents with  . Cough    "not that bad, it's the amount of congestion" green mucus x 11 days  . Head    "stopped up" gets worse about 3:00 am, "mouth breathing"    HISTORY OF PRESENT ILLNESS: This is a 38 y.o. male complaining of 10-day history of productive cough with chest and nasal congestion.  Also having head congestion worse at night.  Wife and 52-year-old son were also recently sick.  No recent traveling.  No other significant symptoms. Past medical history includes ulcerative colitis. HPI   Prior to Admission medications   Medication Sig Start Date End Date Taking? Authorizing Provider  mesalamine (LIALDA) 1.2 g EC tablet TAKE 1 TABLET (1.2 G TOTAL) BY MOUTH DAILY WITH BREAKFAST. Patient taking differently: TAKE 4 TABLETs (1.2 G TOTAL) BY MOUTH DAILY 12/22/15  Yes McVey, Gelene Mink, PA-C  predniSONE (DELTASONE) 20 MG tablet Take 2 tablets daily with breakfast. 02/05/18  Yes Jaynee Eagles, PA-C  albuterol (PROVENTIL HFA;VENTOLIN HFA) 108 (90 Base) MCG/ACT inhaler Inhale 2 puffs into the lungs every 6 (six) hours as needed for wheezing or shortness of breath (cough, shortness of breath or wheezing.). Patient not taking: Reported on 07/01/2018 02/05/18   Jaynee Eagles, PA-C  permethrin (ELIMITE) 5 % cream Apply the lotion from the neck down and leave on for 12 hours.  Repeat in 7 days. Patient not taking: Reported on 02/05/2018 10/11/17   Tereasa Coop, PA-C    No Known Allergies  Patient Active Problem List   Diagnosis Date Noted  . Ulcerative colitis ([place]) 03/04/2012    Past Medical History:  Diagnosis Date  . Colitis   . Pinched nerve    low back    Past Surgical History:  Procedure Laterality Date  . COLONOSCOPY      Social History   Socioeconomic History  . Marital status: Married    Spouse name: [place]  . Number of children: 0  . Years of education: 60  . Highest education level: Not on file    Occupational History  . Occupation: Tax inspector    Comment: Agricultural consultant  Social Needs  . Financial resource strain: Not on file  . Food insecurity:    Worry: Not on file    Inability: Not on file  . Transportation needs:    Medical: Not on file    Non-medical: Not on file  Tobacco Use  . Smoking status: Former Smoker    Packs/day: 0.50    Types: Cigarettes  . Smokeless tobacco: Never Used  . Tobacco comment: has quit x 8 weeks previously, will quit by 05/2012 for insurance costs.  Substance and Sexual Activity  . Alcohol use: Yes    Alcohol/week: 0.0 - 2.0 standard drinks  . Drug use: No  . Sexual activity: Yes    Partners: Female    Birth control/protection: Condom  Lifestyle  . Physical activity:    Days per week: Not on file    Minutes per session: Not on file  . Stress: Not on file  Relationships  . Social connections:    Talks on phone: Not on file    Gets together: Not on file    Attends religious service: Not on file    Active member of club or organization: Not on file    Attends meetings of clubs or organizations: Not on file    Relationship status: Not on file  .  Intimate partner violence:    Fear of current or ex partner: Not on file    Emotionally abused: Not on file    Physically abused: Not on file    Forced sexual activity: Not on file  Other Topics Concern  . Not on file  Social History Narrative   Lives with his wife.   His parents live next door.   He will be moving to [place] for his new job (job starts 04/24/2015).    Family History  Problem Relation Age of Onset  . Colitis Mother      Review of Systems  Constitutional: Positive for fever.  HENT: Positive for congestion and sinus pain.   Eyes: Negative for discharge and redness.  Respiratory: Positive for cough and sputum production. Negative for shortness of breath and wheezing.   Cardiovascular: Negative for chest pain and palpitations.   Gastrointestinal: Negative for abdominal pain, diarrhea, nausea and vomiting.  Genitourinary: Negative.   Musculoskeletal: Negative.   Skin: Negative for rash.  Neurological: Positive for headaches.  Endo/Heme/Allergies: Negative.   All other systems reviewed and are negative.  Vitals:   07/01/18 1359  BP: 131/82  Pulse: 73  Resp: 16  Temp: 98.4 F (36.9 C)  SpO2: 95%     Physical Exam Vitals signs reviewed.  Constitutional:      Appearance: Normal appearance.  HENT:     Head: Normocephalic and atraumatic.     Nose: Congestion present.     Mouth/Throat:     Mouth: Mucous membranes are moist.     Pharynx: Oropharynx is clear.  Eyes:     Extraocular Movements: Extraocular movements intact.     Conjunctiva/sclera: Conjunctivae normal.     Pupils: Pupils are equal, round, and reactive to light.  Cardiovascular:     Rate and Rhythm: Normal rate and regular rhythm.     Pulses: Normal pulses.     Heart sounds: Normal heart sounds.  Pulmonary:     Effort: Pulmonary effort is normal.     Breath sounds: Normal breath sounds.  Abdominal:     Tenderness: There is no abdominal tenderness.  Skin:    General: Skin is warm and dry.     Capillary Refill: Capillary refill takes less than 2 seconds.  Neurological:     General: No focal deficit present.     Mental Status: He is alert and oriented to person, place, and time.  Psychiatric:        Mood and Affect: Mood normal.        Behavior: Behavior normal.    A total of 25 minutes was spent in the room with the patient, greater than 50% of which was in counseling/coordination of care regarding differential diagnosis, treatment, medications, prognosis, and need for follow-up if no better or worse.   ASSESSMENT & PLAN: Spencer Mccarthy was seen today for cough and head.  Diagnoses and all orders for this visit:  Acute non-recurrent maxillary sinusitis -     amoxicillin-clavulanate (AUGMENTIN) 875-125 MG tablet; Take 1 tablet by mouth  2 (two) times daily for 7 days.  Sinus congestion -     pseudoephedrine-guaifenesin (MUCINEX D) 60-600 MG 12 hr tablet; Take 1 tablet by mouth every 12 (twelve) hours for 5 days. -     triamcinolone (NASACORT) 55 MCG/ACT AERO nasal inhaler; Place 2 sprays into the nose daily.  Cough    Patient Instructions       If you have lab work done today you will be  contacted with your lab results within the next 2 weeks.  If you have not heard from [place] then please contact us. The fastest way to get your results is to register for My Chart.   IF you received an x-ray today, you will receive an invoice from [facility]. Please contact [facility] at [phone] with questions or concerns regarding your invoice.   IF you received labwork today, you will receive an invoice from [facility]. Please contact [facility] at [phone] with questions or concerns regarding your invoice.   Our billing staff will not be able to assist you with questions regarding bills from these companies.  You will be contacted with the lab results as soon as they are available. The fastest way to get your results is to activate your My Chart account. Instructions are located on the last page of this paperwork. If you have not heard from [place] regarding the results in 2 weeks, please contact this office.     Sinusitis, Adult Sinusitis is soreness and swelling (inflammation) of your sinuses. Sinuses are hollow spaces in the bones around your face. They are located:  Around your eyes.  In the middle of your forehead.  Behind your nose.  In your cheekbones. Your sinuses and nasal passages are lined with a fluid called mucus. Mucus drains out of your sinuses. Swelling can trap mucus in your sinuses. This lets germs (bacteria, virus, or fungus) grow, which leads to infection. Most of the time, this condition is caused by a virus. What are the causes? This condition is caused  by:  Allergies.  Asthma.  Germs.  Things that block your nose or sinuses.  Growths in the nose (nasal polyps).  Chemicals or irritants in the air.  Fungus (rare). What increases the risk? You are more likely to develop this condition if:  You have a weak body defense system (immune system).  You do a lot of swimming or diving.  You use nasal sprays too much.  You smoke. What are the signs or symptoms? The main symptoms of this condition are pain and a feeling of pressure around the sinuses. Other symptoms include:  Stuffy nose (congestion).  Runny nose (drainage).  Swelling and warmth in the sinuses.  Headache.  Toothache.  A cough that may get worse at night.  Mucus that collects in the throat or the back of the nose (postnasal drip).  Being unable to smell and taste.  Being very tired (fatigue).  A fever.  Sore throat.  Bad breath. How is this diagnosed? This condition is diagnosed based on:  Your symptoms.  Your medical history.  A physical exam.  Tests to find out if your condition is short-term (acute) or long-term (chronic). Your doctor may: ? Check your nose for growths (polyps). ? Check your sinuses using a tool that has a light (endoscope). ? Check for allergies or germs. ? Do imaging tests, such as an MRI or CT scan. How is this treated? Treatment for this condition depends on the cause and whether it is short-term or long-term.  If caused by a virus, your symptoms should go away on their own within 10 days. You may be given medicines to relieve symptoms. They include: ? Medicines that shrink swollen tissue in the nose. ? Medicines that treat allergies (antihistamines). ? A spray that treats swelling of the nostrils. ? Rinses that help get rid of thick mucus in your nose (nasal saline washes).  If caused by bacteria, your doctor may wait  to see if you will get better without treatment. You may be given antibiotic medicine if you  have: ? A very bad infection. ? A weak body defense system.  If caused by growths in the nose, you may need to have surgery. Follow these instructions at home: Medicines  Take, use, or apply over-the-counter and prescription medicines only as told by your doctor. These may include nasal sprays.  If you were prescribed an antibiotic medicine, take it as told by your doctor. Do not stop taking the antibiotic even if you start to feel better. Hydrate and humidify   Drink enough water to keep your pee (urine) pale yellow.  Use a cool mist humidifier to keep the humidity level in your home above 50%.  Breathe in steam for 10-15 minutes, 3-4 times a day, or as told by your doctor. You can do this in the bathroom while a hot shower is running.  Try not to spend time in cool or dry air. Rest  Rest as much as you can.  Sleep with your head raised (elevated).  Make sure you get enough sleep each night. General instructions   Put a warm, moist washcloth on your face 3-4 times a day, or as often as told by your doctor. This will help with discomfort.  Wash your hands often with soap and water. If there is no soap and water, use hand sanitizer.  Do not smoke. Avoid being around people who are smoking (secondhand smoke).  Keep all follow-up visits as told by your doctor. This is important. Contact a doctor if:  You have a fever.  Your symptoms get worse.  Your symptoms do not get better within 10 days. Get help right away if:  You have a very bad headache.  You cannot stop throwing up (vomiting).  You have very bad pain or swelling around your face or eyes.  You have trouble seeing.  You feel confused.  Your neck is stiff.  You have trouble breathing. Summary  Sinusitis is swelling of your sinuses. Sinuses are hollow spaces in the bones around your face.  This condition is caused by tissues in your nose that become inflamed or swollen. This traps germs. These can  lead to infection.  If you were prescribed an antibiotic medicine, take it as told by your doctor. Do not stop taking it even if you start to feel better.  Keep all follow-up visits as told by your doctor. This is important. This information is not intended to replace advice given to you by your health care provider. Make sure you discuss any questions you have with your health care provider. Document Released: 10/16/2007 Document Revised: 09/29/2017 Document Reviewed: 09/29/2017 Elsevier Interactive Patient Education  2019 Elsevier Inc.      Agustina Caroli, MD [facility]

## 2019-06-26 IMAGING — DX DG CHEST 2V
2 series · 2 of 2 positions shown · non-contrast
Comparison: None.

CLINICAL DATA: Ulcerative colitis.  Patient is starting Humira

EXAM:
CHEST - 2 VIEW

[dg chest 2 view (1 of 2)]
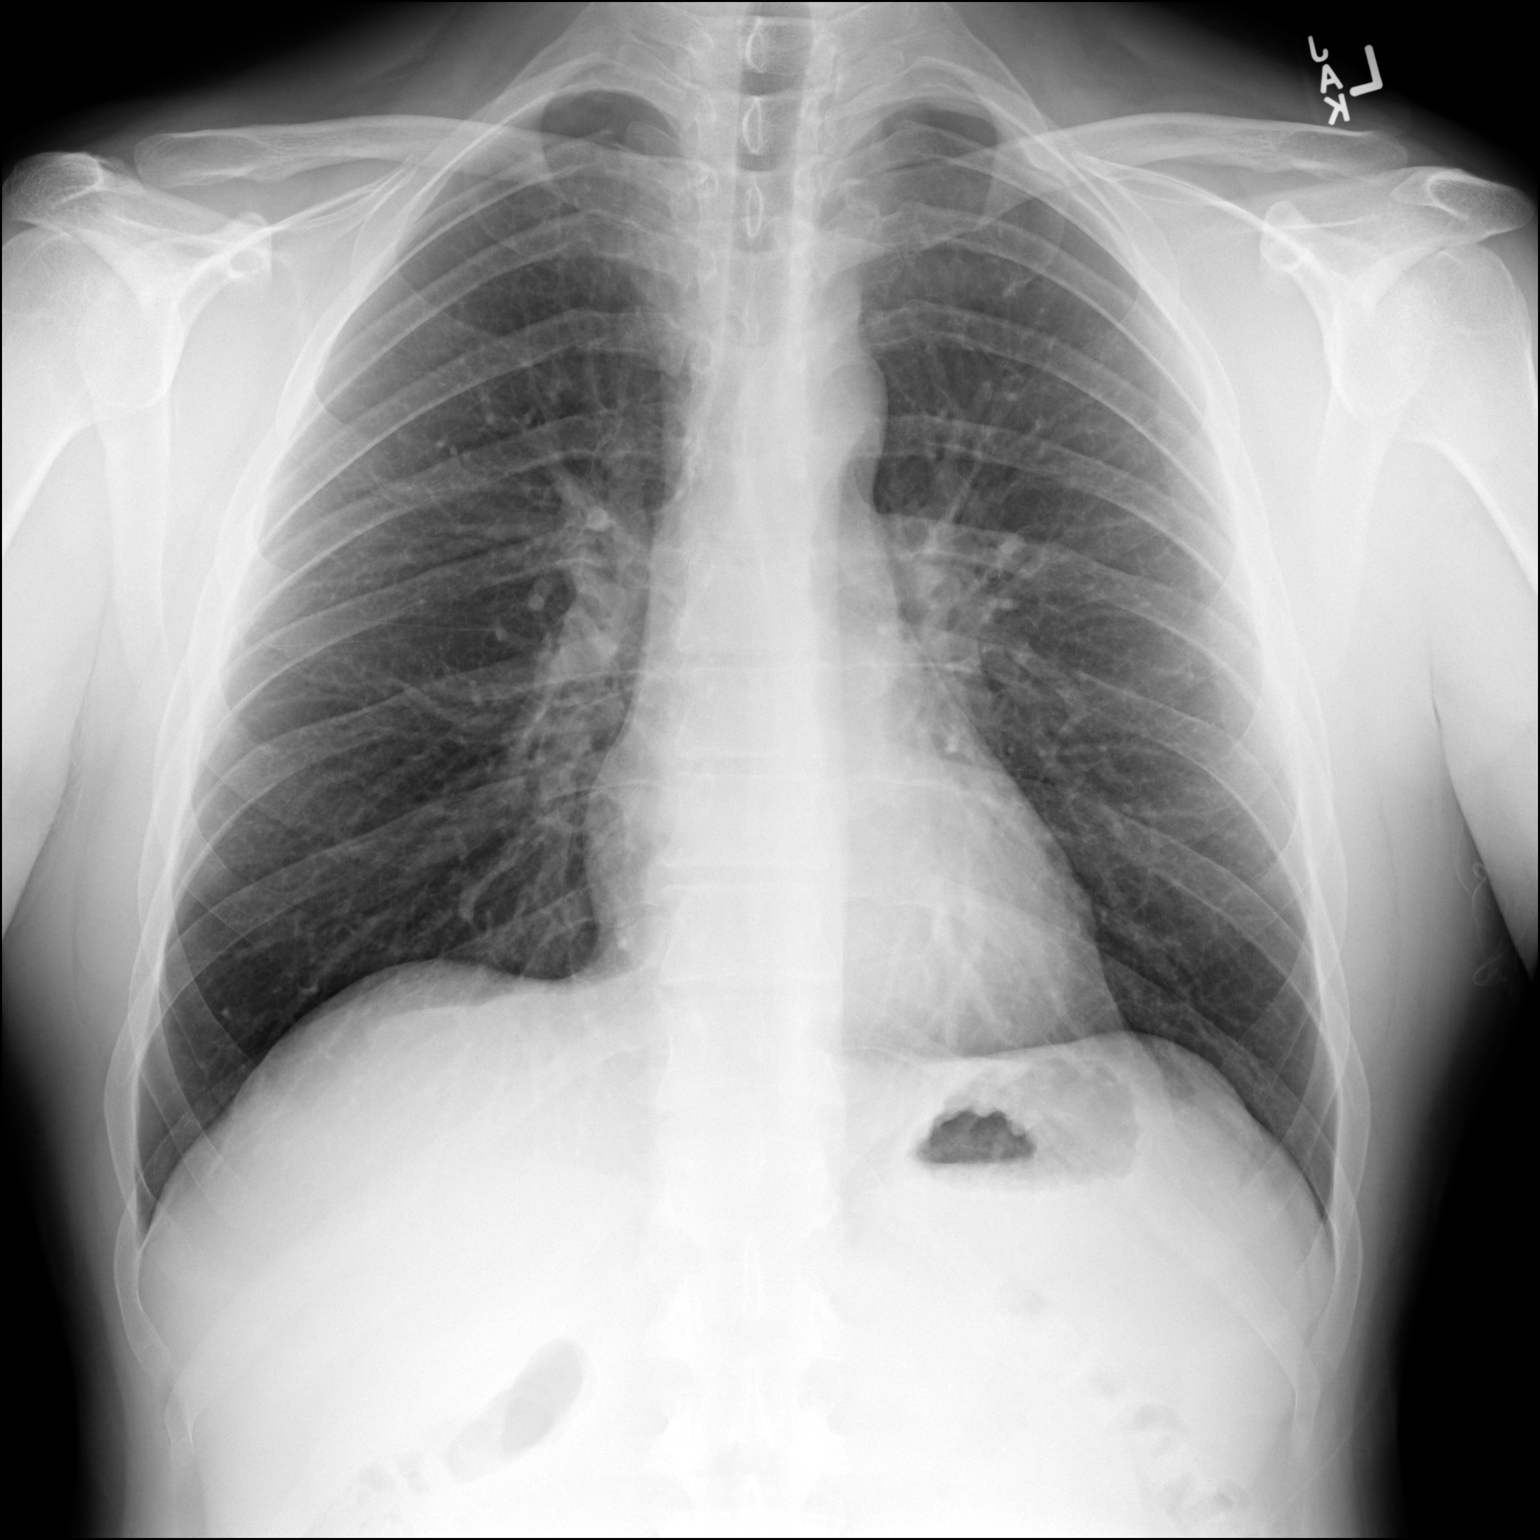

[dg chest 2 view (2 of 2)]
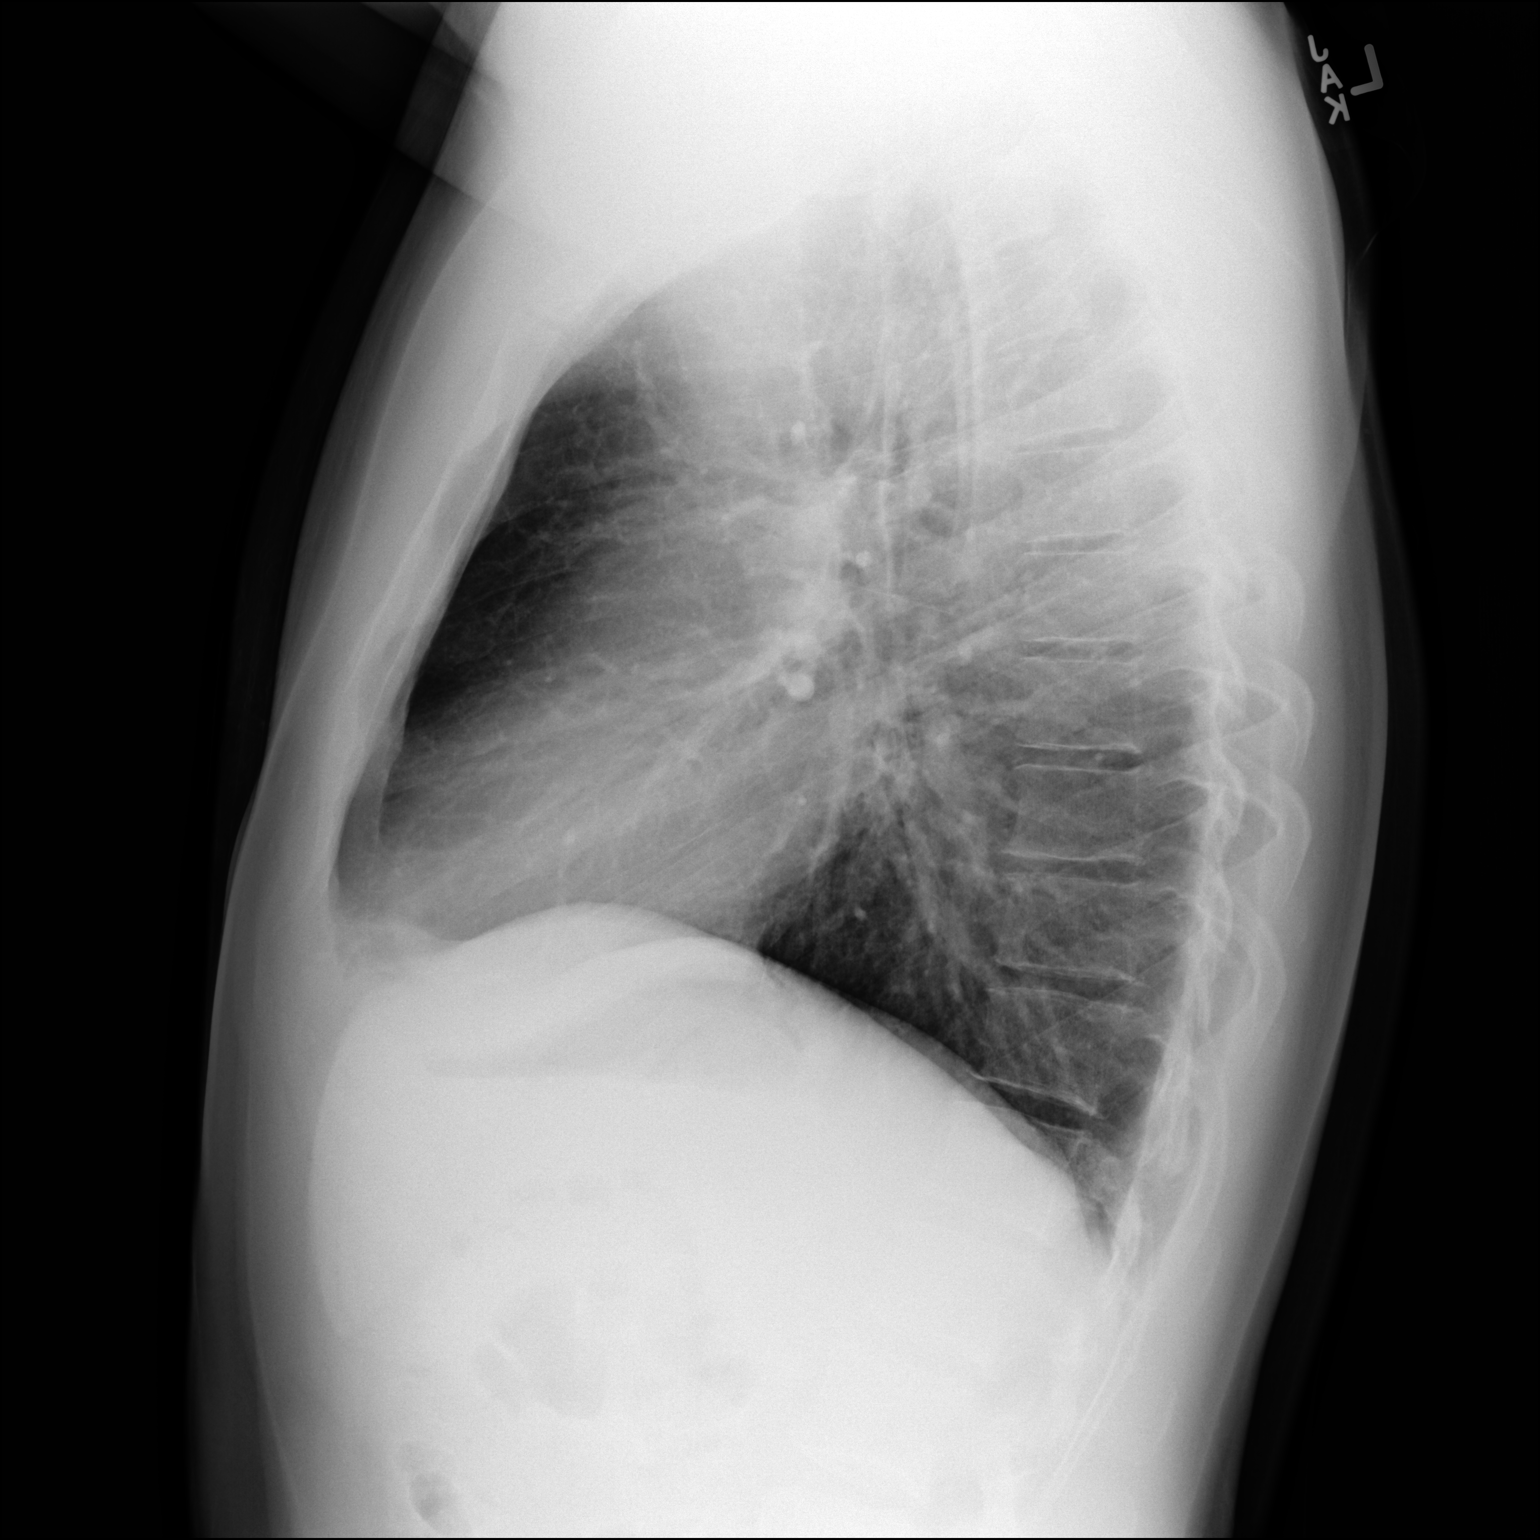

[2 of 2 positions shown; findings below may reference images not displayed]

FINDINGS: The heart size and mediastinal contours are within normal limits.
Both lungs are clear. The visualized skeletal structures are
unremarkable.
IMPRESSION: Normal chest x-ray.

## 2020-01-17 DIAGNOSIS — Z7960 Long term (current) use of unspecified immunomodulators and immunosuppressants: Secondary | ICD-10-CM | POA: Insufficient documentation

## 2020-12-11 ENCOUNTER — Encounter: Payer: Self-pay | Admitting: Emergency Medicine

## 2020-12-11 ENCOUNTER — Ambulatory Visit
Admission: EM | Admit: 2020-12-11 | Discharge: 2020-12-11 | Disposition: A | Discharge status: Home / Self Care | Payer: 59 | Attending: Urgent Care | Admitting: Urgent Care

## 2020-12-11 ENCOUNTER — Other Ambulatory Visit: Payer: Self-pay

## 2020-12-11 DIAGNOSIS — K5180 Other ulcerative colitis without complications: Secondary | ICD-10-CM

## 2020-12-11 DIAGNOSIS — R059 Cough, unspecified: Secondary | ICD-10-CM

## 2020-12-11 DIAGNOSIS — Z8616 Personal history of COVID-19: Secondary | ICD-10-CM

## 2020-12-11 DIAGNOSIS — J0181 Other acute recurrent sinusitis: Secondary | ICD-10-CM | POA: Diagnosis not present

## 2020-12-11 MED ORDER — BENZONATATE 100 MG PO CAPS: 100.0000 mg | ORAL_CAPSULE | Freq: Three times a day (TID) | ORAL | 0 refills | Status: DC | PRN

## 2020-12-11 MED ORDER — PROMETHAZINE-DM 6.25-15 MG/5ML PO SYRP: 5.0000 mL | ORAL_SOLUTION | Freq: Every evening | ORAL | 0 refills | Status: DC | PRN

## 2020-12-11 MED ORDER — CETIRIZINE HCL 10 MG PO TABS: 10.0000 mg | ORAL_TABLET | Freq: Every day | ORAL | 0 refills | Status: DC

## 2020-12-11 MED ORDER — AMOXICILLIN 875 MG PO TABS: 875.0000 mg | ORAL_TABLET | Freq: Two times a day (BID) | ORAL | 0 refills | Status: DC

## 2020-12-11 NOTE — ED Triage Notes (Signed)
Pt here for cough and congestion x 1 week; pt sts multiple neg covid tests

## 2020-12-11 NOTE — ED Provider Notes (Signed)
[place]   MRN: [id number] DOB: 11-Feb-1981  Subjective:   Spencer Mccarthy is a 40 y.o. male presenting for 1 week history of persistent and worsening sinus congestion, fatigue, weakness, cough.  Patient has had multiple sick contacts, all have tested negative for COVID-19 including himself.  He has had in the past and reports that it feels different for him.  He is on prednisone now for his ulcerative colitis.  Has been using over-the-counter medications with minimal relief.  Denies chest pain, shortness of breath, wheezing, body aches.  Patient quit smoking 7 years ago.  No current facility-administered medications for this encounter.  Current Outpatient Medications:    albuterol (PROVENTIL HFA;VENTOLIN HFA) 108 (90 Base) MCG/ACT inhaler, Inhale 2 puffs into the lungs every 6 (six) hours as needed for wheezing or shortness of breath (cough, shortness of breath or wheezing.). (Patient not taking: Reported on 07/01/2018), Disp: 1 Inhaler, Rfl: 1   mesalamine (LIALDA) 1.2 g EC tablet, TAKE 1 TABLET (1.2 G TOTAL) BY MOUTH DAILY WITH BREAKFAST. (Patient taking differently: TAKE 4 TABLETs (1.2 G TOTAL) BY MOUTH DAILY), Disp: 30 tablet, Rfl: 3   triamcinolone (NASACORT) 55 MCG/ACT AERO nasal inhaler, Place 2 sprays into the nose daily., Disp: 1 Inhaler, Rfl: 12   No Known Allergies  Past Medical History:  Diagnosis Date   Colitis    Pinched nerve    low back     Past Surgical History:  Procedure Laterality Date   COLONOSCOPY      Family History  Problem Relation Age of Onset   Colitis Mother     Social History   Tobacco Use   Smoking status: Former    Packs/day: 0.50    Types: Cigarettes   Smokeless tobacco: Never   Tobacco comments:    has quit x 8 weeks previously, will quit by 05/2012 for insurance costs.  Substance Use Topics   Alcohol use: Yes    Alcohol/week: 0.0 - 2.0 standard drinks   Drug use: No    ROS   Objective:   Vitals: BP (!) 157/91  (BP Location: Left Arm)   Pulse 90   Temp 97.9 F (36.6 C) (Oral)   Resp 18   SpO2 97%   Physical Exam Constitutional:      General: He is not in acute distress.    Appearance: Normal appearance. He is well-developed. He is not ill-appearing, toxic-appearing or diaphoretic.  HENT:     Head: Normocephalic and atraumatic.     Right Ear: External ear normal.     Left Ear: External ear normal.     Nose: Nose normal.     Mouth/Throat:     Mouth: Mucous membranes are moist.     Pharynx: No oropharyngeal exudate or posterior oropharyngeal erythema.  Eyes:     General: No scleral icterus.       Right eye: No discharge.        Left eye: No discharge.     Extraocular Movements: Extraocular movements intact.     Conjunctiva/sclera: Conjunctivae normal.     Pupils: Pupils are equal, round, and reactive to light.  Cardiovascular:     Rate and Rhythm: Normal rate and regular rhythm.     Heart sounds: Normal heart sounds. No murmur heard.   No friction rub. No gallop.  Pulmonary:     Effort: Pulmonary effort is normal. No respiratory distress.     Breath sounds: Normal breath sounds. No stridor. No wheezing, rhonchi or  rales.  Skin:    General: Skin is warm and dry.  Neurological:     Mental Status: He is alert and oriented to person, place, and time.  Psychiatric:        Mood and Affect: Mood normal.        Behavior: Behavior normal.        Thought Content: Thought content normal.        Judgment: Judgment normal.    Assessment and Plan :   PDMP not reviewed this encounter.  1. Other acute recurrent sinusitis   2. Cough   3. Other ulcerative colitis without complication (HCC)   4. History of COVID-19     Will start empiric treatment for sinusitis with amoxicillin especially in light of him being immunocompromised as he takes prednisone for his ulcerative colitis.  Recommended supportive care otherwise including the use of oral antihistamine, decongestant.  Maintain regular  medications for his ulcerative colitis.  Deferred COVID-19 testing as all tests have been negative for him and his family.  Counseled patient on potential for adverse effects with medications prescribed/recommended today, ER and return-to-clinic precautions discussed, patient verbalized understanding.    Jaynee Eagles, PA-C 12/11/20 1350

## 2021-09-09 ENCOUNTER — Encounter: Payer: Self-pay | Admitting: Emergency Medicine

## 2021-09-09 ENCOUNTER — Ambulatory Visit
Admission: EM | Admit: 2021-09-09 | Discharge: 2021-09-09 | Disposition: A | Discharge status: Home / Self Care | Payer: 59

## 2021-09-09 DIAGNOSIS — Z20818 Contact with and (suspected) exposure to other bacterial communicable diseases: Secondary | ICD-10-CM

## 2021-09-09 DIAGNOSIS — J029 Acute pharyngitis, unspecified: Secondary | ICD-10-CM | POA: Diagnosis not present

## 2021-09-09 MED ORDER — AMOXICILLIN 500 MG PO CAPS: 500.0000 mg | ORAL_CAPSULE | Freq: Two times a day (BID) | ORAL | 0 refills | Status: DC

## 2021-09-09 NOTE — ED Provider Notes (Signed)
?Spencer Mccarthy ? ? ?MRN: [id number] DOB: July 20, 1980 ? ?Subjective:  ? ?ABEM SHADDIX is a 41 y.o. male presenting for 1 day history of cute onset throat pain, painful swallowing.  Patient has had to take care of his daughter who tested positive for strep today. ? ?No current facility-administered medications for this encounter. ? ?Current Outpatient Medications:  ?  mesalamine (LIALDA) 1.2 g EC tablet, TAKE 1 TABLET (1.2 G TOTAL) BY MOUTH DAILY WITH BREAKFAST. (Patient taking differently: TAKE 4 TABLETs (1.2 G TOTAL) BY MOUTH DAILY), Disp: 30 tablet, Rfl: 3 ?  Tofacitinib Citrate (XELJANZ) 10 MG TABS, Take 1 tablet by mouth 2 (two) times daily., Disp: , Rfl:  ?  albuterol (PROVENTIL HFA;VENTOLIN HFA) 108 (90 Base) MCG/ACT inhaler, Inhale 2 puffs into the lungs every 6 (six) hours as needed for wheezing or shortness of breath (cough, shortness of breath or wheezing.). (Patient not taking: Reported on 07/01/2018), Disp: 1 Inhaler, Rfl: 1 ?  mesalamine (LIALDA) 1.2 g EC tablet, Take 4 tablets by mouth daily., Disp: , Rfl:   ? ?No Known Allergies ? ?Past Medical History:  ?Diagnosis Date  ? Colitis   ? Pinched nerve   ? low back  ?  ? ?Past Surgical History:  ?Procedure Laterality Date  ? COLONOSCOPY    ? ? ?Family History  ?Problem Relation Age of Onset  ? Colitis Mother   ? ? ?Social History  ? ?Tobacco Use  ? Smoking status: Former  ?  Packs/day: 0.50  ?  Types: Cigarettes  ? Smokeless tobacco: Never  ? Tobacco comments:  ?  has quit x 8 weeks previously, will quit by 05/2012 for insurance costs.  ?Substance Use Topics  ? Alcohol use: Yes  ?  Alcohol/week: 0.0 - 2.0 standard drinks  ? Drug use: No  ? ? ?ROS ? ? ?Objective:  ? ?Vitals: ?BP 131/80 (BP Location: Left Arm)   Pulse (!) 52   Temp 98.5 ?F (36.9 ?C) (Oral)   Resp 18   Ht 5' 9"  (1.753 m)   Wt 180 lb (81.6 kg)   SpO2 95%   BMI 26.58 kg/m?  ? ?Physical Exam ?Constitutional:   ?   General: He is not in acute distress. ?   Appearance: Normal  appearance. He is well-developed and normal weight. He is not ill-appearing, toxic-appearing or diaphoretic.  ?HENT:  ?   Head: Normocephalic and atraumatic.  ?   Right Ear: External ear normal.  ?   Left Ear: External ear normal.  ?   Nose: Nose normal.  ?   Mouth/Throat:  ?   Pharynx: Oropharyngeal exudate and posterior oropharyngeal erythema present. No pharyngeal swelling or uvula swelling.  ?   Tonsils: Tonsillar exudate present. No tonsillar abscesses. 0 on the right. 0 on the left.  ?Eyes:  ?   General: No scleral icterus.    ?   Right eye: No discharge.     ?   Left eye: No discharge.  ?   Extraocular Movements: Extraocular movements intact.  ?Cardiovascular:  ?   Rate and Rhythm: Normal rate.  ?Pulmonary:  ?   Effort: Pulmonary effort is normal.  ?Musculoskeletal:  ?   Cervical back: Normal range of motion.  ?Neurological:  ?   Mental Status: He is alert and oriented to person, place, and time.  ?Psychiatric:     ?   Mood and Affect: Mood normal.     ?   Behavior: Behavior normal.     ?  Thought Content: Thought content normal.     ?   Judgment: Judgment normal.  ? ? ?Assessment and Plan :  ? ?PDMP not reviewed this encounter. ? ?1. Acute pharyngitis, unspecified etiology   ?2. Strep throat exposure   ? ? ?Will treat empirically for pharyngitis given physical exam findings and close exposure.  Patient is to start amoxicillin, use supportive care otherwise. Counseled patient on potential for adverse effects with medications prescribed/recommended today, ER and return-to-clinic precautions discussed, patient verbalized understanding. ? ?  ?Jaynee Eagles, PA-C ?09/09/21 1504 ? ?

## 2021-09-09 NOTE — ED Triage Notes (Signed)
Patient c/o sore throat x 1 day, daughter tested positive for strep this morning. ?

## 2021-10-09 ENCOUNTER — Other Ambulatory Visit: Payer: Self-pay

## 2021-10-09 ENCOUNTER — Ambulatory Visit
Admission: EM | Admit: 2021-10-09 | Discharge: 2021-10-09 | Disposition: A | Discharge status: Home / Self Care | Payer: 59 | Attending: Internal Medicine | Admitting: Internal Medicine

## 2021-10-09 ENCOUNTER — Encounter: Payer: Self-pay | Admitting: Emergency Medicine

## 2021-10-09 DIAGNOSIS — J069 Acute upper respiratory infection, unspecified: Secondary | ICD-10-CM | POA: Diagnosis present

## 2021-10-09 DIAGNOSIS — J029 Acute pharyngitis, unspecified: Secondary | ICD-10-CM | POA: Insufficient documentation

## 2021-10-09 LAB — POCT RAPID STREP A (OFFICE): Rapid Strep A Screen: NEGATIVE

## 2021-10-09 MED ORDER — CETIRIZINE HCL 10 MG PO TABS: 10.0000 mg | ORAL_TABLET | Freq: Every day | ORAL | 0 refills | Status: AC

## 2021-10-09 MED ORDER — BENZONATATE 100 MG PO CAPS: 100.0000 mg | ORAL_CAPSULE | Freq: Three times a day (TID) | ORAL | 0 refills | Status: DC | PRN

## 2021-10-09 MED ORDER — FLUTICASONE PROPIONATE 50 MCG/ACT NA SUSP: 1.0000 | Freq: Every day | NASAL | 0 refills | Status: AC

## 2021-10-09 NOTE — ED Provider Notes (Signed)
[name]    CSN: [id number] Arrival date & time: 10/09/21  1611      History   Chief Complaint Chief Complaint  Patient presents with   Cough   Nasal Congestion    HPI Spencer Mccarthy is a 41 y.o. male.   Patient presents with cough, sore throat, and nasal congestion that has been present for approximately 4 days.  His son has had similar symptoms recently.  Patient denies any known fevers at home.  Denies chest pain, shortness of breath, ear pain, nausea, vomiting, diarrhea, abdominal pain.  Patient has taken DayQuil for symptoms with minimal improvement.   Cough  Past Medical History:  Diagnosis Date   Colitis    Pinched nerve    low back    Patient Active Problem List   Diagnosis Date Noted   Acute non-recurrent maxillary sinusitis 07/01/2018   Ulcerative colitis ([place]) 03/04/2012    Past Surgical History:  Procedure Laterality Date   COLONOSCOPY         Home Medications    Prior to Admission medications   Medication Sig Start Date End Date Taking? Authorizing Provider  benzonatate (TESSALON) 100 MG capsule Take 1 capsule (100 mg total) by mouth every 8 (eight) hours as needed for cough. 10/09/21  Yes , Michele Rockers, FNP  cetirizine (ZYRTEC) 10 MG tablet Take 1 tablet (10 mg total) by mouth daily. 10/09/21  Yes , Hildred Alamin E, FNP  fluticasone (FLONASE) 50 MCG/ACT nasal spray Place 1 spray into both nostrils daily for 3 days. 10/09/21 10/12/21 Yes , Michele Rockers, FNP  albuterol (PROVENTIL HFA;VENTOLIN HFA) 108 (90 Base) MCG/ACT inhaler Inhale 2 puffs into the lungs every 6 (six) hours as needed for wheezing or shortness of breath (cough, shortness of breath or wheezing.). Patient not taking: Reported on 07/01/2018 02/05/18   Jaynee Eagles, PA-C  amoxicillin (AMOXIL) 500 MG capsule Take 1 capsule (500 mg total) by mouth 2 (two) times daily. Patient not taking: Reported on 10/09/2021 09/09/21   Jaynee Eagles, PA-C  mesalamine (LIALDA) 1.2 g EC tablet TAKE  1 TABLET (1.2 G TOTAL) BY MOUTH DAILY WITH BREAKFAST. Patient taking differently: TAKE 4 TABLETs (1.2 G TOTAL) BY MOUTH DAILY 12/22/15   McVey, Gelene Mink, PA-C  mesalamine (LIALDA) 1.2 g EC tablet Take 4 tablets by mouth daily.    [provider]  Tofacitinib Citrate (XELJANZ) 10 MG TABS Take 1 tablet by mouth 2 (two) times daily. 05/25/20   [provider]    Family History Family History  Problem Relation Age of Onset   Colitis Mother     Social History Social History   Tobacco Use   Smoking status: Former    Packs/day: 0.50    Types: Cigarettes   Smokeless tobacco: Never   Tobacco comments:    has quit x 8 weeks previously, will quit by 05/2012 for insurance costs.  Substance Use Topics   Alcohol use: Yes    Alcohol/week: 0.0 - 2.0 standard drinks   Drug use: No     Allergies   Patient has no known allergies.   Review of Systems Review of Systems Per HPI  Physical Exam Triage Vital Signs ED Triage Vitals  Enc Vitals Group     BP 10/09/21 1740 137/88     Pulse Rate 10/09/21 1740 (!) 58     Resp 10/09/21 1740 18     Temp 10/09/21 1740 98.1 F (36.7 C)     Temp Source 10/09/21 1740  Oral     SpO2 10/09/21 1740 97 %     Weight --      Height --      Head Circumference --      Peak Flow --      Pain Score 10/09/21 1741 5     Pain Loc --      Pain Edu? --      Excl. in [place]? --    No data found.  Updated Vital Signs BP 137/88 (BP Location: Left Arm)   Pulse (!) 58   Temp 98.1 F (36.7 C) (Oral)   Resp 18   SpO2 97%   Visual Acuity Right Eye Distance:   Left Eye Distance:   Bilateral Distance:    Right Eye Near:   Left Eye Near:    Bilateral Near:     Physical Exam Constitutional:      General: He is not in acute distress.    Appearance: Normal appearance. He is not toxic-appearing or diaphoretic.  HENT:     Head: Normocephalic and atraumatic.     Right Ear: Tympanic membrane and ear canal normal.     Left Ear:  Tympanic membrane and ear canal normal.     Nose: Congestion present.     Mouth/Throat:     Mouth: Mucous membranes are moist.     Pharynx: Posterior oropharyngeal erythema present.  Eyes:     Extraocular Movements: Extraocular movements intact.     Conjunctiva/sclera: Conjunctivae normal.     Pupils: Pupils are equal, round, and reactive to light.  Cardiovascular:     Rate and Rhythm: Normal rate and regular rhythm.     Pulses: Normal pulses.     Heart sounds: Normal heart sounds.  Pulmonary:     Effort: Pulmonary effort is normal. No respiratory distress.     Breath sounds: Normal breath sounds. No stridor. No wheezing, rhonchi or rales.  Abdominal:     General: Abdomen is flat. Bowel sounds are normal.     Palpations: Abdomen is soft.  Musculoskeletal:        General: Normal range of motion.     Cervical back: Normal range of motion.  Skin:    General: Skin is warm and dry.  Neurological:     General: No focal deficit present.     Mental Status: He is alert and oriented to person, place, and time. Mental status is at baseline.  Psychiatric:        Mood and Affect: Mood normal.        Behavior: Behavior normal.     UC Treatments / Results  Labs (all labs ordered are listed, but only abnormal results are displayed) Labs Reviewed  CULTURE, GROUP A STREP ([place])  NOVEL CORONAVIRUS, NAA  POCT RAPID STREP A (OFFICE)    EKG   Radiology No results found.  Procedures Procedures (including critical care time)  Medications Ordered in UC Medications - No data to display  Initial Impression / Assessment and Plan / UC Course  I have reviewed the triage vital signs and the nursing notes.  Pertinent labs & imaging results that were available during my care of the patient were reviewed by me and considered in my medical decision making (see chart for details).     Patient presents with symptoms likely from a viral upper respiratory infection. Differential includes  bacterial pneumonia, sinusitis, allergic rhinitis, COVID-19, flu. Do not suspect underlying cardiopulmonary process. Symptoms seem unlikely related to ACS, CHF or  COPD exacerbations, pneumonia, pneumothorax. Patient is nontoxic appearing and not in need of emergent medical intervention.  Strep was negative.  Throat culture and COVID test pending.  Recommended symptom control with over the counter medications.  Patient sent prescriptions.  Return if symptoms fail to improve in 1-2 weeks or you develop shortness of breath, chest pain, severe headache. Patient states understanding and is agreeable.  Discharged with PCP followup.  Final Clinical Impressions(s) / UC Diagnoses   Final diagnoses:  Viral upper respiratory tract infection with cough  Sore throat     Discharge Instructions      It appears that you have a viral upper respiratory infection that should run its course and self resolve in the next few days with symptomatic treatment.  You have been prescribed 3 medications to help alleviate symptoms.  Rapid strep is negative.  Throat culture and COVID test pending.  We will call if it is positive.    ED Prescriptions     Medication Sig Dispense Auth. Provider   cetirizine (ZYRTEC) 10 MG tablet Take 1 tablet (10 mg total) by mouth daily. 30 tablet [place], [place] E, [place]   fluticasone [facility]) 50 MCG/ACT nasal spray Place 1 spray into both nostrils daily for 3 days. 16 g , Hildred Alamin E, [place]   benzonatate (TESSALON) 100 MG capsule Take 1 capsule (100 mg total) by mouth every 8 (eight) hours as needed for cough. 21 capsule [place], Michele Rockers, [place]      PDMP not reviewed this encounter.   Teodora Medici, [place] 10/09/21 1807

## 2021-10-09 NOTE — Discharge Instructions (Addendum)
It appears that you have a viral upper respiratory infection that should run its course and self resolve in the next few days with symptomatic treatment.  You have been prescribed 3 medications to help alleviate symptoms.  Rapid strep is negative.  Throat culture and COVID test pending.  We will call if it is positive.

## 2021-10-09 NOTE — ED Triage Notes (Signed)
Pt here for cough and nasal congestion x 4 days

## 2021-10-10 LAB — NOVEL CORONAVIRUS, NAA: SARS-CoV-2, NAA: NOT DETECTED

## 2021-10-12 LAB — CULTURE, GROUP A STREP (THRC)

## 2022-01-27 ENCOUNTER — Ambulatory Visit (INDEPENDENT_AMBULATORY_CARE_PROVIDER_SITE_OTHER): Payer: 59

## 2022-01-27 ENCOUNTER — Other Ambulatory Visit: Payer: Self-pay

## 2022-01-27 ENCOUNTER — Ambulatory Visit
Admission: EM | Admit: 2022-01-27 | Discharge: 2022-01-27 | Disposition: A | Discharge status: Home / Self Care | Payer: 59 | Attending: Family Medicine | Admitting: Family Medicine

## 2022-01-27 ENCOUNTER — Encounter: Payer: Self-pay | Admitting: Emergency Medicine

## 2022-01-27 DIAGNOSIS — T148XXA Other injury of unspecified body region, initial encounter: Secondary | ICD-10-CM

## 2022-01-27 DIAGNOSIS — S60221A Contusion of right hand, initial encounter: Secondary | ICD-10-CM

## 2022-01-27 DIAGNOSIS — M79641 Pain in right hand: Secondary | ICD-10-CM

## 2022-01-27 DIAGNOSIS — S2232XA Fracture of one rib, left side, initial encounter for closed fracture: Secondary | ICD-10-CM

## 2022-01-27 MED ORDER — CYCLOBENZAPRINE HCL 10 MG PO TABS: 10.0000 mg | ORAL_TABLET | Freq: Three times a day (TID) | ORAL | 0 refills | Status: AC | PRN
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
## 2022-01-28 ENCOUNTER — Encounter (HOSPITAL_BASED_OUTPATIENT_CLINIC_OR_DEPARTMENT_OTHER): Payer: Self-pay

## 2022-01-28 ENCOUNTER — Emergency Department (HOSPITAL_BASED_OUTPATIENT_CLINIC_OR_DEPARTMENT_OTHER): Payer: 59

## 2022-01-28 ENCOUNTER — Other Ambulatory Visit: Payer: Self-pay

## 2022-01-28 ENCOUNTER — Emergency Department (HOSPITAL_BASED_OUTPATIENT_CLINIC_OR_DEPARTMENT_OTHER)
Admission: EM | Admit: 2022-01-28 | Discharge: 2022-01-28 | Disposition: A | Discharge status: Home / Self Care | Payer: 59 | Attending: Emergency Medicine | Admitting: Emergency Medicine

## 2022-01-28 DIAGNOSIS — S299XXA Unspecified injury of thorax, initial encounter: Secondary | ICD-10-CM | POA: Diagnosis present

## 2022-01-28 DIAGNOSIS — S2232XA Fracture of one rib, left side, initial encounter for closed fracture: Secondary | ICD-10-CM | POA: Diagnosis not present

## 2022-01-28 DIAGNOSIS — Y92410 Unspecified street and highway as the place of occurrence of the external cause: Secondary | ICD-10-CM | POA: Diagnosis not present

## 2022-01-28 DIAGNOSIS — S0990XA Unspecified injury of head, initial encounter: Secondary | ICD-10-CM | POA: Diagnosis not present
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
## 2022-01-28 NOTE — Discharge Instructions (Signed)
Note your work-up today was overall reassuring.  CT imaging of your head was negative for any acute abnormalities.  Recommend close follow-up with your primary care provider for reevaluation of your symptoms.  In the meantime, continue symptomatic therapy with rest, Tylenol/ibuprofen and muscle laxer as needed.  Please do not hesitate to return to the emergency department the worrisome signs and symptoms we discussed become apparent.
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
## 2023-08-08 ENCOUNTER — Ambulatory Visit
Admission: EM | Admit: 2023-08-08 | Discharge: 2023-08-08 | Disposition: A | Discharge status: Home / Self Care | Attending: Family Medicine | Admitting: Family Medicine

## 2023-08-08 DIAGNOSIS — K625 Hemorrhage of anus and rectum: Secondary | ICD-10-CM | POA: Insufficient documentation

## 2023-08-08 DIAGNOSIS — J209 Acute bronchitis, unspecified: Secondary | ICD-10-CM | POA: Diagnosis not present

## 2023-08-08 DIAGNOSIS — J019 Acute sinusitis, unspecified: Secondary | ICD-10-CM

## 2023-08-08 DIAGNOSIS — K51511 Left sided colitis with rectal bleeding: Secondary | ICD-10-CM | POA: Insufficient documentation

## 2023-08-08 MED ORDER — ALBUTEROL SULFATE HFA 108 (90 BASE) MCG/ACT IN AERS: 2.0000 | INHALATION_SPRAY | RESPIRATORY_TRACT | 0 refills | Status: AC | PRN

## 2023-08-08 MED ORDER — CEFDINIR 300 MG PO CAPS: 600.0000 mg | ORAL_CAPSULE | Freq: Every day | ORAL | 0 refills | Status: AC

## 2023-08-08 MED ORDER — BENZONATATE 100 MG PO CAPS: 100.0000 mg | ORAL_CAPSULE | Freq: Three times a day (TID) | ORAL | 0 refills | Status: AC | PRN

## 2023-08-08 MED ORDER — PREDNISONE 20 MG PO TABS: 40.0000 mg | ORAL_TABLET | Freq: Every day | ORAL | 0 refills | Status: AC

## 2023-08-08 NOTE — Discharge Instructions (Signed)
 Take cefdinir 300 mg--2 capsules together daily for 7 days  Take benzonatate 100 mg, 1 tab every 8 hours as needed for cough.  Take prednisone 20 mg--2 daily for 5 days  Albuterol inhaler--do 2 puffs every 4 hours as needed for shortness of breath or wheezing

## 2023-08-08 NOTE — ED Triage Notes (Signed)
"  This started about the beginning of March, stopped up nose for 2 wks +, then lost my voice for a week, now turned into a productive cough and waking up due to the Cough at night". No fever.

## 2023-08-08 NOTE — ED Provider Notes (Signed)
 [name]    CSN: [id number] Arrival date & time: 08/08/23  0836      History   Chief Complaint Chief Complaint  Patient presents with   Cough   Nasal Congestion    HPI Spencer Mccarthy is a 43 y.o. male.    Cough Here for cough and congestion has been going on for at least 2 weeks.  In the middle of it he may have had some fever.  No vomiting or diarrhea.  At first he had a lot of nasal drainage and now he has more postnasal drainage and some sinus pressure.  He has been hoarse at some point but not so much now.  Maybe some shortness of breath when exercising recently.  He did smoke in the past and has had some trouble with "coughing fits" whenever he gets sick since he quit smoking.  NKDA  Past Medical History:  Diagnosis Date   Colitis    Pinched nerve    low back    Patient Active Problem List   Diagnosis Date Noted   Rectal bleeding 08/08/2023   Left sided ulcerative colitis with rectal bleeding (HCC) 08/08/2023   Long-term use of immunosuppressant medication 01/17/2020   Acute non-recurrent maxillary sinusitis 07/01/2018   Ulcerative colitis with rectal bleeding (HCC) 03/04/2012    Past Surgical History:  Procedure Laterality Date   COLONOSCOPY         Home Medications    Prior to Admission medications   Medication Sig Start Date End Date Taking? Authorizing Provider  adalimumab (HUMIRA, 2 PEN,) 40 MG/0.4ML pen  06/29/19  Yes [provider]  albuterol (VENTOLIN HFA) 108 (90 Base) MCG/ACT inhaler Inhale 2 puffs into the lungs every 4 (four) hours as needed for wheezing or shortness of breath. 08/08/23  Yes Bristyl Mclees, Janace Aris, MD  benzonatate (TESSALON) 100 MG capsule Take 1 capsule (100 mg total) by mouth 3 (three) times daily as needed for cough. 08/08/23  Yes Zenia Resides, MD  cefdinir (OMNICEF) 300 MG capsule Take 2 capsules (600 mg total) by mouth daily for 7 days. 08/08/23 08/15/23 Yes Zenia Resides, MD   Hydrocortisone Acetate 10 % FOAM Place rectally. 05/26/20  Yes [provider]  ipratropium (ATROVENT) 0.03 % nasal spray Place 2 sprays into the nose 2 (two) times daily. 06/18/22  Yes [provider]  Na Sulfate-K Sulfate-Mg Sulfate concentrate (SUPREP) 17.5-3.13-1.6 GM/177ML SOLN Take 1 kit by mouth once. 02/26/22  Yes [provider]  predniSONE (DELTASONE) 20 MG tablet Take 2 tablets (40 mg total) by mouth daily with breakfast for 5 days. 08/08/23 08/13/23 Yes Zenia Resides, MD  triamcinolone (NASACORT) 55 MCG/ACT AERO nasal inhaler Place 2 sprays into the nose daily. 10/18/20  Yes [provider]  cetirizine (ZYRTEC) 10 MG tablet Take 1 tablet (10 mg total) by mouth daily. 10/09/21   Gustavus Bryant, FNP  cyclobenzaprine (FLEXERIL) 10 MG tablet Take 1 tablet (10 mg total) by mouth 3 (three) times daily as needed for muscle spasms. Do not drink alcohol or drive while taking this medication.  May cause drowsiness. 01/27/22   Particia Nearing, PA-C  fluticasone [facility]) 50 MCG/ACT nasal spray Place 1 spray into both nostrils daily for 3 days. 10/09/21 10/12/21  Gustavus Bryant, FNP  mesalamine (LIALDA) 1.2 g EC tablet TAKE 1 TABLET (1.2 G TOTAL) BY MOUTH DAILY WITH BREAKFAST. Patient taking differently: TAKE 4 TABLETs (1.2 G TOTAL) BY MOUTH DAILY 12/22/15  McVey, Madelaine Bhat, PA-C  mesalamine (LIALDA) 1.2 g EC tablet Take 4 tablets by mouth daily.    [provider]  predniSONE (DELTASONE) 5 MG tablet Take 5 mg by mouth as directed.    [provider]  saccharomyces boulardii (FLORASTOR) 250 MG capsule Take 250 mg by mouth daily at 2 PM.    [provider]  Tofacitinib Citrate (XELJANZ) 10 MG TABS Take 1 tablet by mouth daily at 2 PM. 05/25/20  Yes [provider]    Family History Family History  Problem Relation Age of Onset   Colitis Mother     Social History Social History   Tobacco Use   Smoking status:  Former    Current packs/day: 0.50    Types: Cigarettes   Smokeless tobacco: Never   Tobacco comments:    has quit x 8 weeks previously, will quit by 05/2012 for insurance costs.  Vaping Use   Vaping status: Never Used  Substance Use Topics   Alcohol use: Yes    Alcohol/week: 0.0 - 2.0 standard drinks of alcohol    Comment: Occassionally   Drug use: Never     Allergies   Patient has no known allergies.   Review of Systems Review of Systems  Respiratory:  Positive for cough.      Physical Exam Triage Vital Signs ED Triage Vitals  Encounter Vitals Group     BP 08/08/23 0857 118/76     Systolic BP Percentile --      Diastolic BP Percentile --      Pulse Rate 08/08/23 0857 (!) 54     Resp 08/08/23 0857 18     Temp 08/08/23 0857 98.4 F (36.9 C)     Temp Source 08/08/23 0857 Oral     SpO2 08/08/23 0857 97 %     Weight 08/08/23 0855 180 lb (81.6 kg)     Height 08/08/23 0855 5\' 8"  (1.727 m)     Head Circumference --      Peak Flow --      Pain Score 08/08/23 0854 0     Pain Loc --      Pain Education --      Exclude from Growth Chart --    No data found.  Updated Vital Signs BP 118/76 (BP Location: Left Arm)   Pulse (!) 52   Temp 98.4 F (36.9 C) (Oral)   Resp 20   Ht 5\' 8"  (1.727 m)   Wt 81.6 kg   SpO2 97%   BMI 27.37 kg/m   Visual Acuity Right Eye Distance:   Left Eye Distance:   Bilateral Distance:    Right Eye Near:   Left Eye Near:    Bilateral Near:     Physical Exam Vitals reviewed.  Constitutional:      General: He is not in acute distress.    Appearance: He is not ill-appearing, toxic-appearing or diaphoretic.  HENT:     Right Ear: Tympanic membrane and ear canal normal.     Left Ear: Tympanic membrane and ear canal normal.     Nose: Congestion present.     Mouth/Throat:     Mouth: Mucous membranes are moist.     Comments: There is mild erythema of the posterior oropharynx.  There is clear exudate draining Eyes:     Extraocular  Movements: Extraocular movements intact.     Conjunctiva/sclera: Conjunctivae normal.     Pupils: Pupils are equal, round, and reactive to light.  Cardiovascular:     Rate and Rhythm: Normal rate and regular rhythm.     Heart sounds: No murmur heard. Pulmonary:     Effort: Pulmonary effort is normal. No respiratory distress.     Breath sounds: Normal breath sounds. No stridor. No wheezing, rhonchi or rales.  Musculoskeletal:     Cervical back: Neck supple.  Lymphadenopathy:     Cervical: No cervical adenopathy.  Skin:    Capillary Refill: Capillary refill takes less than 2 seconds.     Coloration: Skin is not jaundiced or pale.  Neurological:     General: No focal deficit present.     Mental Status: He is alert and oriented to person, place, and time.  Psychiatric:        Behavior: Behavior normal.      UC Treatments / Results  Labs (all labs ordered are listed, but only abnormal results are displayed) Labs Reviewed - No data to display  EKG   Radiology No results found.  Procedures Procedures (including critical care time)  Medications Ordered in UC Medications - No data to display  Initial Impression / Assessment and Plan / UC Course  I have reviewed the triage vital signs and the nursing notes.  Pertinent labs & imaging results that were available during my care of the patient were reviewed by me and considered in my medical decision making (see chart for details).     Truman Hayward is sent in to treat the sinusitis.  Tessalon Perles are sent in for symptoms.  Since some of this sounds like bronchitis, prednisone is also sent in along with an albuterol inhaler in case he needs it. Final Clinical Impressions(s) / UC Diagnoses   Final diagnoses:  Acute non-recurrent sinusitis, unspecified location  Acute bronchitis, unspecified organism     Discharge Instructions      Take cefdinir 300 mg--2 capsules together daily for 7 days  Take benzonatate 100 mg, 1 tab  every 8 hours as needed for cough.  Take prednisone 20 mg--2 daily for 5 days  Albuterol inhaler--do 2 puffs every 4 hours as needed for shortness of breath or wheezing       ED Prescriptions     Medication Sig Dispense Auth. Provider   cefdinir (OMNICEF) 300 MG capsule Take 2 capsules (600 mg total) by mouth daily for 7 days. 14 capsule Agam Davenport, Janace Aris, MD   benzonatate (TESSALON) 100 MG capsule Take 1 capsule (100 mg total) by mouth 3 (three) times daily as needed for cough. 21 capsule Zenia Resides, MD   albuterol (VENTOLIN HFA) 108 (90 Base) MCG/ACT inhaler Inhale 2 puffs into the lungs every 4 (four) hours as needed for wheezing or shortness of breath. 1 each Zenia Resides, MD   predniSONE (DELTASONE) 20 MG tablet Take 2 tablets (40 mg total) by mouth daily with breakfast for 5 days. 10 tablet Marlinda Mike Janace Aris, MD      PDMP not reviewed this encounter.   Zenia Resides, MD 08/08/23 [phone]
